# Patient Record
Sex: Female | Born: 1985 | Race: Black or African American | Hispanic: No | Marital: Married | Smoking: Never smoker
Health system: Southern US, Community
[De-identification: ages and names within clinical notes are randomized; demographics above are authoritative.]

## PROBLEM LIST (undated history)

## (undated) DIAGNOSIS — K219 Gastro-esophageal reflux disease without esophagitis: Secondary | ICD-10-CM

## (undated) DIAGNOSIS — D649 Anemia, unspecified: Secondary | ICD-10-CM

## (undated) DIAGNOSIS — R000 Tachycardia, unspecified: Secondary | ICD-10-CM

## (undated) DIAGNOSIS — E0500 Thyrotoxicosis with diffuse goiter without thyrotoxic crisis or storm: Secondary | ICD-10-CM

---

## 2012-12-30 ENCOUNTER — Encounter (HOSPITAL_COMMUNITY): Payer: Self-pay | Admitting: Emergency Medicine

## 2012-12-30 ENCOUNTER — Emergency Department (HOSPITAL_COMMUNITY)
Admission: EM | Admit: 2012-12-30 | Discharge: 2012-12-30 | Disposition: A | Discharge status: Home / Self Care | Payer: BC Managed Care – PPO | Attending: Emergency Medicine | Admitting: Emergency Medicine

## 2012-12-30 DIAGNOSIS — Z862 Personal history of diseases of the blood and blood-forming organs and certain disorders involving the immune mechanism: Secondary | ICD-10-CM | POA: Insufficient documentation

## 2012-12-30 DIAGNOSIS — Z8639 Personal history of other endocrine, nutritional and metabolic disease: Secondary | ICD-10-CM | POA: Insufficient documentation

## 2012-12-30 DIAGNOSIS — K089 Disorder of teeth and supporting structures, unspecified: Secondary | ICD-10-CM | POA: Insufficient documentation

## 2012-12-30 DIAGNOSIS — K029 Dental caries, unspecified: Secondary | ICD-10-CM | POA: Insufficient documentation

## 2012-12-30 DIAGNOSIS — R0982 Postnasal drip: Secondary | ICD-10-CM | POA: Insufficient documentation

## 2012-12-30 DIAGNOSIS — R0981 Nasal congestion: Secondary | ICD-10-CM

## 2012-12-30 DIAGNOSIS — H9209 Otalgia, unspecified ear: Secondary | ICD-10-CM | POA: Insufficient documentation

## 2012-12-30 DIAGNOSIS — J3489 Other specified disorders of nose and nasal sinuses: Secondary | ICD-10-CM | POA: Insufficient documentation

## 2012-12-30 HISTORY — DX: Thyrotoxicosis with diffuse goiter without thyrotoxic crisis or storm: E05.00

## 2012-12-30 MED ORDER — TRAMADOL HCL 50 MG PO TABS: 50.0000 mg | ORAL_TABLET | Freq: Four times a day (QID) | ORAL | Status: DC | PRN

## 2012-12-30 MED ORDER — OXYCODONE-ACETAMINOPHEN 5-325 MG PO TABS
2.0000 | ORAL_TABLET | Freq: Once | ORAL | Status: AC
  Administered 2012-12-30: 2 via ORAL
  Filled 2012-12-30: qty 2

## 2012-12-30 MED ORDER — PSEUDOEPHEDRINE HCL 60 MG PO TABS: 60.0000 mg | ORAL_TABLET | ORAL | Status: DC | PRN

## 2012-12-30 NOTE — ED Provider Notes (Signed)
CSN: [id number]     Arrival date & time 12/30/12  2158 History   First MD Initiated Contact with Patient 12/30/12 2204     Chief Complaint  Patient presents with  . Otalgia  . Dental Pain   (Consider location/radiation/quality/duration/timing/severity/associated sxs/prior Treatment) HPI Pt is a 27yo female c/o right sided dental pain and right ear pain x1 week, worsened Monday.  Had root canal 13mo ago.  Also c/o nasal congestion. Dental pain is constant, aching and throbbing, 7/10, worse with chewing.  Denies difficulty breathing or swallowing.  No fever, n/v/d. Has f/u appointment tomorrow afternoon for dental pain. Has taken aleve at home with minimal relief.  Past Medical History  Diagnosis Date  . Graves disease    History reviewed. No pertinent past surgical history. History reviewed. No pertinent family history. History  Substance Use Topics  . Smoking status: Never Smoker   . Smokeless tobacco: Not on file  . Alcohol Use: Yes   OB History   Grav Para Term Preterm Abortions TAB SAB Ect Mult Living                 Review of Systems  Constitutional: Negative for fever and chills.  HENT: Positive for congestion, dental problem, ear pain ( right) and postnasal drip. Negative for rhinorrhea, sinus pressure, sore throat, trouble swallowing and voice change.   Respiratory: Negative for shortness of breath.   Cardiovascular: Negative for chest pain.  Gastrointestinal: Negative for nausea, vomiting and abdominal pain.  All other systems reviewed and are negative.    Allergies  Review of patient's allergies indicates not on file.  Home Medications   Current Outpatient Rx  Name  Route  Sig  Dispense  Refill  . pseudoephedrine (SUDAFED) 60 MG tablet   Oral   Take 1 tablet (60 mg total) by mouth every 4 (four) hours as needed for congestion.   15 tablet   0   . traMADol (ULTRAM) 50 MG tablet   Oral   Take 1 tablet (50 mg total) by mouth every 6 (six) hours as needed  for moderate pain.   15 tablet   0    BP 140/85  Temp(Src) 98.8 F (37.1 C)  Resp 20  Ht 5\' 11"  (1.803 m)  Wt 285 lb (129.275 kg)  BMI 39.77 kg/m2  SpO2 100%  LMP 12/16/2012 Physical Exam  Nursing note and vitals reviewed. Constitutional: She is oriented to person, place, and time. She appears well-developed and well-nourished.  HENT:  Head: Normocephalic and atraumatic.  Right Ear: Hearing, tympanic membrane, external ear and ear canal normal.  Left Ear: Hearing, tympanic membrane, external ear and ear canal normal.  Nose: Mucosal edema present. Right sinus exhibits no maxillary sinus tenderness and no frontal sinus tenderness. Left sinus exhibits no maxillary sinus tenderness and no frontal sinus tenderness.  Mouth/Throat: Uvula is midline, oropharynx is clear and moist and mucous membranes are normal. No trismus in the jaw. Dental caries present. No dental abscesses.    Eyes: EOM are normal.  Neck: Normal range of motion.  Cardiovascular: Normal rate.   Pulmonary/Chest: Effort normal. No respiratory distress.  Musculoskeletal: Normal range of motion.  Neurological: She is alert and oriented to person, place, and time.  Skin: Skin is warm and dry.  Psychiatric: She has a normal mood and affect. Her behavior is normal.    ED Course  Procedures (including critical care time) Labs Review Labs Reviewed - No data to display Imaging Review No results  found.  EKG Interpretation   None       MDM   1. Pain due to dental caries   2. Nasal congestion    Pt with dental pain due to dental caries. No dental abscess. Pt also c/o nasal congestion.  Pt has f/u tomorrow with dentist, requesting pain medication until then.  On exam, no evidence of sinusitis, cellulitis, or dental abscess. Tx in ED: percocet. Rx: tramadol and sudafed. Advised to keep f/u appointment with dentist tomorrow. Pt verbalized understanding and agreement with tx plan.      Junius Finner, PA-C 12/31/12  [phone]

## 2012-12-30 NOTE — ED Notes (Signed)
Pt c/o rt ear pain and rt sided jaw pain.

## 2013-01-06 NOTE — ED Provider Notes (Signed)
Medical screening examination/treatment/procedure(s) were performed by non-physician practitioner and as supervising physician I was immediately available for consultation/collaboration.  EKG Interpretation   None         Robey Massmann W. Aarianna Hoadley, MD 01/06/13 0049 

## 2013-03-31 ENCOUNTER — Encounter (HOSPITAL_COMMUNITY): Payer: Self-pay | Admitting: Emergency Medicine

## 2013-03-31 ENCOUNTER — Emergency Department (HOSPITAL_COMMUNITY)
Admission: EM | Admit: 2013-03-31 | Discharge: 2013-03-31 | Discharge status: Against Medical Advice | Payer: BC Managed Care – PPO | Source: Home / Self Care

## 2013-03-31 ENCOUNTER — Emergency Department (HOSPITAL_COMMUNITY)
Admission: EM | Admit: 2013-03-31 | Discharge: 2013-04-01 | Disposition: A | Discharge status: Home / Self Care | Payer: BC Managed Care – PPO | Attending: Emergency Medicine | Admitting: Emergency Medicine

## 2013-03-31 ENCOUNTER — Emergency Department (HOSPITAL_COMMUNITY): Payer: BC Managed Care – PPO

## 2013-03-31 DIAGNOSIS — IMO0002 Reserved for concepts with insufficient information to code with codable children: Secondary | ICD-10-CM | POA: Insufficient documentation

## 2013-03-31 DIAGNOSIS — Z880 Allergy status to penicillin: Secondary | ICD-10-CM | POA: Insufficient documentation

## 2013-03-31 DIAGNOSIS — Z79899 Other long term (current) drug therapy: Secondary | ICD-10-CM | POA: Insufficient documentation

## 2013-03-31 DIAGNOSIS — Z862 Personal history of diseases of the blood and blood-forming organs and certain disorders involving the immune mechanism: Secondary | ICD-10-CM | POA: Insufficient documentation

## 2013-03-31 DIAGNOSIS — Z91040 Latex allergy status: Secondary | ICD-10-CM | POA: Insufficient documentation

## 2013-03-31 DIAGNOSIS — R209 Unspecified disturbances of skin sensation: Secondary | ICD-10-CM | POA: Insufficient documentation

## 2013-03-31 DIAGNOSIS — R1013 Epigastric pain: Secondary | ICD-10-CM | POA: Insufficient documentation

## 2013-03-31 DIAGNOSIS — R109 Unspecified abdominal pain: Secondary | ICD-10-CM

## 2013-03-31 DIAGNOSIS — Z3202 Encounter for pregnancy test, result negative: Secondary | ICD-10-CM | POA: Insufficient documentation

## 2013-03-31 DIAGNOSIS — R1012 Left upper quadrant pain: Secondary | ICD-10-CM | POA: Insufficient documentation

## 2013-03-31 DIAGNOSIS — Z8639 Personal history of other endocrine, nutritional and metabolic disease: Secondary | ICD-10-CM | POA: Insufficient documentation

## 2013-03-31 LAB — URINE MICROSCOPIC-ADD ON

## 2013-03-31 LAB — CBC WITH DIFFERENTIAL/PLATELET
BASOS PCT: 0 % (ref 0–1)
Basophils Absolute: 0 10*3/uL (ref 0.0–0.1)
EOS ABS: 0.2 10*3/uL (ref 0.0–0.7)
Eosinophils Relative: 2 % (ref 0–5)
HEMATOCRIT: 34.2 % — AB (ref 36.0–46.0)
Hemoglobin: 11.6 g/dL — ABNORMAL LOW (ref 12.0–15.0)
LYMPHS ABS: 2.3 10*3/uL (ref 0.7–4.0)
Lymphocytes Relative: 31 % (ref 12–46)
MCH: 27.3 pg (ref 26.0–34.0)
MCHC: 33.9 g/dL (ref 30.0–36.0)
MCV: 80.5 fL (ref 78.0–100.0)
MONO ABS: 0.4 10*3/uL (ref 0.1–1.0)
MONOS PCT: 6 % (ref 3–12)
NEUTROS ABS: 4.5 10*3/uL (ref 1.7–7.7)
NEUTROS PCT: 61 % (ref 43–77)
Platelets: 225 10*3/uL (ref 150–400)
RBC: 4.25 MIL/uL (ref 3.87–5.11)
RDW: 13.8 % (ref 11.5–15.5)
WBC: 7.4 10*3/uL (ref 4.0–10.5)

## 2013-03-31 LAB — URINALYSIS, ROUTINE W REFLEX MICROSCOPIC
Bilirubin Urine: NEGATIVE
Glucose, UA: NEGATIVE mg/dL
KETONES UR: NEGATIVE mg/dL
LEUKOCYTES UA: NEGATIVE
NITRITE: NEGATIVE
PROTEIN: NEGATIVE mg/dL
Specific Gravity, Urine: >1.03 — ABNORMAL HIGH (ref 1.005–1.030)
UROBILINOGEN UA: 0.2 mg/dL (ref 0.0–1.0)
pH: 5.5 (ref 5.0–8.0)

## 2013-03-31 LAB — COMPREHENSIVE METABOLIC PANEL
ALBUMIN: 4.3 g/dL (ref 3.5–5.2)
ALT: 20 U/L (ref 0–35)
AST: 32 U/L (ref 0–37)
Alkaline Phosphatase: 78 U/L (ref 39–117)
BUN: 9 mg/dL (ref 6–23)
CALCIUM: 9 mg/dL (ref 8.4–10.5)
CO2: 27 mEq/L (ref 19–32)
CREATININE: 1.15 mg/dL — AB (ref 0.50–1.10)
Chloride: 102 mEq/L (ref 96–112)
GFR calc Af Amer: 74 mL/min — ABNORMAL LOW (ref >90)
GFR calc non Af Amer: 64 mL/min — ABNORMAL LOW (ref >90)
Glucose, Bld: 89 mg/dL (ref 70–99)
Potassium: 4 mEq/L (ref 3.7–5.3)
SODIUM: 141 meq/L (ref 137–147)
TOTAL PROTEIN: 8.2 g/dL (ref 6.0–8.3)
Total Bilirubin: 1.1 mg/dL (ref 0.3–1.2)

## 2013-03-31 LAB — LIPASE, BLOOD: Lipase: 20 U/L (ref 11–59)

## 2013-03-31 LAB — POC URINE PREG, ED: PREG TEST UR: NEGATIVE

## 2013-03-31 MED ORDER — HYDROMORPHONE HCL PF 1 MG/ML IJ SOLN
0.5000 mg | Freq: Once | INTRAMUSCULAR | Status: AC
  Administered 2013-03-31: 0.5 mg via INTRAVENOUS
  Filled 2013-03-31: qty 1

## 2013-03-31 MED ORDER — IOHEXOL 300 MG/ML  SOLN
100.0000 mL | Freq: Once | INTRAMUSCULAR | Status: AC | PRN
  Administered 2013-03-31: 100 mL via INTRAVENOUS

## 2013-03-31 MED ORDER — OXYCODONE-ACETAMINOPHEN 5-325 MG PO TABS
1.0000 | ORAL_TABLET | ORAL | Status: AC | PRN
Start: 2013-03-31 — End: ?

## 2013-03-31 MED ORDER — ONDANSETRON HCL 4 MG/2ML IJ SOLN
4.0000 mg | Freq: Once | INTRAMUSCULAR | Status: AC
  Administered 2013-03-31: 4 mg via INTRAVENOUS
  Filled 2013-03-31: qty 2

## 2013-03-31 MED ORDER — MORPHINE SULFATE 4 MG/ML IJ SOLN
4.0000 mg | Freq: Once | INTRAMUSCULAR | Status: AC
  Administered 2013-03-31: 4 mg via INTRAVENOUS
  Filled 2013-03-31: qty 1

## 2013-03-31 MED ORDER — HYDROMORPHONE HCL PF 1 MG/ML IJ SOLN
1.0000 mg | Freq: Once | INTRAMUSCULAR | Status: AC
  Administered 2013-03-31: 1 mg via INTRAVENOUS
  Filled 2013-03-31: qty 1

## 2013-03-31 MED ORDER — IOHEXOL 300 MG/ML  SOLN
50.0000 mL | Freq: Once | INTRAMUSCULAR | Status: AC | PRN
  Administered 2013-03-31: 50 mL via ORAL

## 2013-03-31 NOTE — Discharge Instructions (Signed)

## 2013-03-31 NOTE — ED Notes (Signed)
Pt in c/o abd pain that started after sneezing, pt developed epigastric cramping, denies n/v, pain has continued since

## 2013-03-31 NOTE — ED Notes (Addendum)
Pt luq pain that radiates down. Pt sneezed this morning and got a cramp in the side of her abdomen. [facility] but was told she would have a 5hr wait so she came here.

## 2013-03-31 NOTE — ED Notes (Signed)
Pt c/o pain being unchanged after pain medication, EDP notified.

## 2013-03-31 NOTE — ED Provider Notes (Signed)
CSN: [id number]     Arrival date & time 03/31/13  1939 History  This chart was scribed for Joya Gaskins, MD by Ardelia Mems, ED Scribe. This patient was seen in room APA19/APA19 and the patient's care was started at 8:11 PM.   Chief Complaint  Patient presents with  . Abdominal Pain    Patient is a 28 y.o. female presenting with abdominal pain. The history is provided by the patient. No language interpreter was used.  Abdominal Pain Pain location:  LUQ Pain quality: cramping   Pain radiates to:  LLQ Pain severity:  Moderate Onset quality:  Gradual Duration:  1 day Timing:  Intermittent Progression:  Worsening Chronicity:  New Context comment:  Onset after sneezing Relieved by:  None tried Worsened by:  Nothing tried Ineffective treatments:  None tried Associated symptoms: no diarrhea, no dysuria, no fever, no vaginal bleeding, no vaginal discharge and no vomiting     HPI Comments: Holly Vaughn is a 28 y.o. female who presents to the Emergency Department complaining of intermittent LUQ abdominal pain that radiates to the LLQ abdomen onset this morning. She states that she sneezed this morning which caused the onset of her pain, and that the pain has persisted and worsened througout the day. She describes her pain as "cramping". She reports that she has been having some intermittent numbness on the left side of her abdomen for the past 2 months, and that this flares up when she is under stress. She states that she has not been evaluated for this. She states that she is not having any numbness currently. She denies any surgical abdominal history. She denies fever, vomiting, diarrhea, dysuria, vaginal bleeding or discharge, back pain or any other symptoms.   Past Medical History  Diagnosis Date  . Graves disease    History reviewed. No pertinent past surgical history. History reviewed. No pertinent family history. History  Substance Use Topics  . Smoking status: Never  Smoker   . Smokeless tobacco: Not on file  . Alcohol Use: Yes   OB History   Grav Para Term Preterm Abortions TAB SAB Ect Mult Living                 Review of Systems  Constitutional: Negative for fever.  Gastrointestinal: Positive for abdominal pain. Negative for vomiting and diarrhea.  Genitourinary: Negative for dysuria, vaginal bleeding and vaginal discharge.  Neurological: Positive for numbness (intermittent numbness to left abdomen for the past 2 months). Negative for weakness.  All other systems reviewed and are negative.   Allergies  Erythromycin; Latex; and Penicillins  Home Medications   Current Outpatient Rx  Name  Route  Sig  Dispense  Refill  . ferrous sulfate 325 (65 FE) MG tablet   Oral   Take 325 mg by mouth daily.         . fluticasone (FLONASE) 50 MCG/ACT nasal spray   Each Nare   Place 1 spray into both nostrils daily.          Triage Vitals: BP 145/87  Pulse 93  Temp(Src) 98.6 F (37 C) (Oral)  Resp 24  Ht 5\' 11"  (1.803 m)  Wt 297 lb (134.718 kg)  BMI 41.44 kg/m2  SpO2 100%  LMP 03/21/2013  Physical Exam  Nursing note and vitals reviewed. CONSTITUTIONAL: Well developed/well nourished HEAD: Normocephalic/atraumatic EYES: EOMI/PERRL ENMT: Mucous membranes moist NECK: supple no meningeal signs SPINE:entire spine nontender CV: S1/S2 noted, no murmurs/rubs/gallops noted LUNGS: Lungs are clear to auscultation  bilaterally, no apparent distress ABDOMEN: soft, moderate LUQ tenderness, no rebound or guarding GU:no cva tenderness NEURO: Pt is awake/alert, moves all extremitiesx4, no arm/leg drift.  No facial droop. EXTREMITIES: pulses normal, full ROM SKIN: warm, color normal PSYCH: no abnormalities of mood noted   ED Course  Procedures  DIAGNOSTIC STUDIES: Oxygen Saturation is 100% on RA, normal by my interpretation.    COORDINATION OF CARE: 8:16 PM- Discussed plan to order diagnostic lab work. Will also order Morphine and Zofran. Pt  advised of plan for treatment and pt agrees. 10:31 PM Pt with continued abd pain Will perform CT abd/pelvis She has no lower abd pain and denies vaginal complaints 11:48 PM CT negative Pt stable at this time I feel she is stable for d/c home We discussed strict return precautions   Medications  morphine 4 MG/ML injection 4 mg (4 mg Intravenous Given 03/31/13 2035)  ondansetron (ZOFRAN) injection 4 mg (4 mg Intravenous Given 03/31/13 2034)  HYDROmorphone (DILAUDID) injection 1 mg (1 mg Intravenous Given 03/31/13 2115)   Labs Review Labs Reviewed  URINALYSIS, ROUTINE W REFLEX MICROSCOPIC - Abnormal; Notable for the following:    Specific Gravity, Urine >1.030 (*)    Hgb urine dipstick TRACE (*)    All other components within normal limits  COMPREHENSIVE METABOLIC PANEL - Abnormal; Notable for the following:    Creatinine, Ser 1.15 (*)    GFR calc non Af Amer 64 (*)    GFR calc Af Amer 74 (*)    All other components within normal limits  CBC WITH DIFFERENTIAL - Abnormal; Notable for the following:    Hemoglobin 11.6 (*)    HCT 34.2 (*)    All other components within normal limits  LIPASE, BLOOD  URINE MICROSCOPIC-ADD ON  POC URINE PREG, ED   Imaging Review Ct Abdomen Pelvis W Contrast  03/31/2013   CLINICAL DATA Abdominal pain.  EXAM CT ABDOMEN AND PELVIS WITH CONTRAST  TECHNIQUE Multidetector CT imaging of the abdomen and pelvis was performed using the standard protocol following bolus administration of intravenous contrast.  CONTRAST 50mL OMNIPAQUE IOHEXOL 300 MG/ML SOLN, 100mL OMNIPAQUE IOHEXOL 300 MG/ML SOLN  COMPARISON None.  FINDINGS Visualized lung bases appear normal. No osseous abnormality is noted.  The liver, spleen and pancreas appear normal. No gallstones are noted. Adrenal glands and kidneys appear normal. No hydronephrosis or renal obstruction is noted. No renal or ureteral calculi are noted. The appendix appears normal. There is no evidence bowel obstruction. No  abnormal fluid collection is noted. Uterus and urinary bladder appear normal. Ovaries appear normal. No significant adenopathy is noted.  IMPRESSION No abnormality seen in the abdomen or pelvis.  SIGNATURE  Electronically Signed   By: Roque LiasJames  Green M.D.   On: 03/31/2013 23:24      MDM   Final diagnoses:  Abdominal pain    Nursing notes including past medical history and social history reviewed and considered in documentation Labs/vital reviewed and considered    I personally performed the services described in this documentation, which was scribed in my presence. The recorded information has been reviewed and is accurate.     Joya Gaskinsonald W Danielly Ackerley, MD 03/31/13 [phone]

## 2013-05-23 ENCOUNTER — Encounter (HOSPITAL_COMMUNITY): Payer: Self-pay | Admitting: Emergency Medicine

## 2013-05-23 ENCOUNTER — Emergency Department (HOSPITAL_COMMUNITY)
Admission: EM | Admit: 2013-05-23 | Discharge: 2013-05-24 | Disposition: A | Discharge status: Home / Self Care | Payer: BC Managed Care – PPO | Attending: Emergency Medicine | Admitting: Emergency Medicine

## 2013-05-23 DIAGNOSIS — Z880 Allergy status to penicillin: Secondary | ICD-10-CM | POA: Insufficient documentation

## 2013-05-23 DIAGNOSIS — Z91040 Latex allergy status: Secondary | ICD-10-CM | POA: Insufficient documentation

## 2013-05-23 DIAGNOSIS — K089 Disorder of teeth and supporting structures, unspecified: Secondary | ICD-10-CM | POA: Insufficient documentation

## 2013-05-23 DIAGNOSIS — K029 Dental caries, unspecified: Secondary | ICD-10-CM | POA: Insufficient documentation

## 2013-05-23 DIAGNOSIS — Z862 Personal history of diseases of the blood and blood-forming organs and certain disorders involving the immune mechanism: Secondary | ICD-10-CM | POA: Insufficient documentation

## 2013-05-23 DIAGNOSIS — R599 Enlarged lymph nodes, unspecified: Secondary | ICD-10-CM | POA: Insufficient documentation

## 2013-05-23 DIAGNOSIS — Z79899 Other long term (current) drug therapy: Secondary | ICD-10-CM | POA: Insufficient documentation

## 2013-05-23 DIAGNOSIS — Z8639 Personal history of other endocrine, nutritional and metabolic disease: Secondary | ICD-10-CM | POA: Insufficient documentation

## 2013-05-23 DIAGNOSIS — K08109 Complete loss of teeth, unspecified cause, unspecified class: Secondary | ICD-10-CM | POA: Insufficient documentation

## 2013-05-23 DIAGNOSIS — IMO0002 Reserved for concepts with insufficient information to code with codable children: Secondary | ICD-10-CM | POA: Insufficient documentation

## 2013-05-23 MED ORDER — OXYCODONE-ACETAMINOPHEN 5-325 MG PO TABS
1.0000 | ORAL_TABLET | Freq: Once | ORAL | Status: AC
  Administered 2013-05-23: 1 via ORAL
  Filled 2013-05-23: qty 1

## 2013-05-23 MED ORDER — CLINDAMYCIN HCL 150 MG PO CAPS: 150.0000 mg | ORAL_CAPSULE | Freq: Four times a day (QID) | ORAL | Status: DC

## 2013-05-23 MED ORDER — HYDROCODONE-ACETAMINOPHEN 5-325 MG PO TABS: 1.0000 | ORAL_TABLET | ORAL | Status: DC | PRN

## 2013-05-23 MED ORDER — CLINDAMYCIN HCL 150 MG PO CAPS
300.0000 mg | ORAL_CAPSULE | Freq: Once | ORAL | Status: AC
  Administered 2013-05-23: 300 mg via ORAL
  Filled 2013-05-23: qty 2

## 2013-05-23 NOTE — ED Provider Notes (Signed)
CSN: [id number]     Arrival date & time 05/23/13  2208 History   First MD Initiated Contact with Patient 05/23/13 2255     Chief Complaint  Patient presents with  . Dental Pain     (Consider location/radiation/quality/duration/timing/severity/associated sxs/prior Treatment) Patient is a 28 y.o. female presenting with tooth pain. The history is provided by the patient.  Dental Pain Location:  Upper Upper teeth location:  3/RU 1st molar Quality:  Throbbing and constant Severity:  Severe Onset quality:  Gradual Duration:  4 months Progression:  Worsening Relieved by:  Nothing Worsened by:  Cold food/drink, touching, pressure and hot food/drink Ineffective treatments:  Acetaminophen Associated symptoms: facial pain    Johnston EbbsJessica Miltenberger is a 28 y.o. female who presents to the ED with dental pain. She states that for the past 8 months she has been having problems. She has had one tooth on the lower right extracted and she has had all her wisdom teeth extracted. This tooth started hurting and has gotten progressively worse. The past 24 hours the pain has become severe. She has a dental appointment on May 15th but the pain got to bad to wait. She has had anxiety because she is to take exams tomorrow.   Past Medical History  Diagnosis Date  . Graves disease    History reviewed. No pertinent past surgical history. History reviewed. No pertinent family history. History  Substance Use Topics  . Smoking status: Never Smoker   . Smokeless tobacco: Not on file  . Alcohol Use: Yes   OB History   Grav Para Term Preterm Abortions TAB SAB Ect Mult Living                 Review of Systems Negative except as stated in HPI   Allergies  Erythromycin; Ivp dye; Latex; and Penicillins  Home Medications   Prior to Admission medications   Medication Sig Start Date End Date Taking? Authorizing Provider  ferrous sulfate 325 (65 FE) MG tablet Take 325 mg by mouth daily.    Historical Provider, MD    fluticasone (FLONASE) 50 MCG/ACT nasal spray Place 1 spray into both nostrils daily.    Historical Provider, MD  oxyCODONE-acetaminophen (PERCOCET/ROXICET) 5-325 MG per tablet Take 1 tablet by mouth every 4 (four) hours as needed for severe pain. 03/31/13   Joya Gaskinsonald W Wickline, MD   BP 130/85  Pulse 77  Temp(Src) 98 F (36.7 C) (Oral)  Resp 20  Ht 5\' 10"  (1.778 m)  Wt 286 lb (129.729 kg)  BMI 41.04 kg/m2  SpO2 100%  LMP 05/16/2013 Physical Exam  Nursing note and vitals reviewed. Constitutional: She is oriented to person, place, and time. She appears well-developed and well-nourished.  HENT:  Head: Normocephalic.  Right Ear: Tympanic membrane normal.  Left Ear: Tympanic membrane normal.  Nose: Nose normal.  Mouth/Throat: Uvula is midline, oropharynx is clear and moist and mucous membranes are normal. Dental caries present.    Eyes: Conjunctivae and EOM are normal.  Neck: Neck supple.  Cardiovascular: Normal rate and regular rhythm.   Pulmonary/Chest: Effort normal. She has no wheezes. She has no rales.  Abdominal: Soft. There is no tenderness.  Musculoskeletal: Normal range of motion.  Lymphadenopathy:    Cervical adenopathy: right.  Neurological: She is alert and oriented to person, place, and time. No cranial nerve deficit.  Skin: Skin is warm and dry.  Psychiatric: She has a normal mood and affect. Her behavior is normal.    ED Course  Procedures   MDM  28 y.o. female with dental pain and decay in the right upper 1st molar. Stable for discharge without facial swelling or abscess noted at this time. Will treat for pain and infection and she will follow up with her dentist as scheduled for extraction of the tooth. She will return here as needed.  Discussed with the patient and all questioned fully answered.    Medication List    TAKE these medications       clindamycin 150 MG capsule  Commonly known as:  CLEOCIN  Take 1 capsule (150 mg total) by mouth every 6 (six)  hours.     HYDROcodone-acetaminophen 5-325 MG per tablet  Commonly known as:  NORCO/VICODIN  Take 1 tablet by mouth every 4 (four) hours as needed.      ASK your doctor about these medications       ferrous sulfate 325 (65 FE) MG tablet  Take 325 mg by mouth daily.     fluticasone 50 MCG/ACT nasal spray  Commonly known as:  FLONASE  Place 1 spray into both nostrils daily.     oxyCODONE-acetaminophen 5-325 MG per tablet  Commonly known as:  PERCOCET/ROXICET  Take 1 tablet by mouth every 4 (four) hours as needed for severe pain.         [facility] Orlene OchM Ne[place], [place] 05/24/13 [phone]

## 2013-05-23 NOTE — ED Notes (Signed)
Patient complaining of right upper dental pain.

## 2013-05-23 NOTE — Discharge Instructions (Signed)
Dental Caries °Dental caries is tooth decay. This decay can cause a hole in teeth (cavity) that can get bigger and deeper over time. °HOME CARE °· Brush and floss your teeth. Do this at least two times a day. °· Use a fluoride toothpaste. °· Use a mouth rinse if told by your dentist or doctor. °· Eat less sugary and starchy foods. Drink less sugary drinks. °· Avoid snacking often on sugary and starchy foods. Avoid sipping often on sugary drinks. °· Keep regular checkups and cleanings with your dentist. °· Use fluoride supplements if told by your dentist or doctor. °· Allow fluoride to be applied to teeth if told by your dentist or doctor. °MAKE SURE YOU: °· Understand these instructions. °· Will watch your condition. °· Will get help right away if you are not doing well or get worse. °Document Released: 10/17/2007 Document Revised: 09/09/2012 Document Reviewed: 01/10/2012 °[employer]® Patient Information ©2014 [employer]. ° °Dental Pain °A tooth ache may be caused by cavities (tooth decay). Cavities expose the nerve of the tooth to air and hot or cold temperatures. It may come from an infection or abscess (also called a boil or furuncle) around your tooth. It is also often caused by dental caries (tooth decay). This causes the pain you are having. °DIAGNOSIS  °Your caregiver can diagnose this problem by exam. °TREATMENT  °· If caused by an infection, it may be treated with medications which kill germs (antibiotics) and pain medications as prescribed by your caregiver. Take medications as directed. °· Only take over-the-counter or prescription medicines for pain, discomfort, or fever as directed by your caregiver. °· Whether the tooth ache today is caused by infection or dental disease, you should see your dentist as soon as possible for further care. °SEEK MEDICAL CARE IF: °The exam and treatment you received today has been provided on an emergency basis only. This is not a substitute for complete medical or dental  care. If your problem worsens or new problems (symptoms) appear, and you are unable to meet with your dentist, call or return to this location. °SEEK IMMEDIATE MEDICAL CARE IF:  °· You have a fever. °· You develop redness and swelling of your face, jaw, or neck. °· You are unable to open your mouth. °· You have severe pain uncontrolled by pain medicine. °MAKE SURE YOU:  °· Understand these instructions. °· Will watch your condition. °· Will get help right away if you are not doing well or get worse. °Document Released: 01/07/2005 Document Revised: 04/01/2011 Document Reviewed: 08/26/2007 °[employer]® Patient Information ©2014 [employer]. ° °

## 2013-05-26 NOTE — ED Provider Notes (Signed)
Medical screening examination/treatment/procedure(s) were performed by non-physician practitioner and as supervising physician I was immediately available for consultation/collaboration.   Bodhi Stenglein M Kadeem Hyle, MD 05/26/13 2207 

## 2013-12-13 ENCOUNTER — Emergency Department (HOSPITAL_COMMUNITY): Payer: BC Managed Care – PPO

## 2013-12-13 ENCOUNTER — Encounter (HOSPITAL_COMMUNITY): Payer: Self-pay

## 2013-12-13 ENCOUNTER — Emergency Department (HOSPITAL_COMMUNITY)
Admission: EM | Admit: 2013-12-13 | Discharge: 2013-12-13 | Disposition: A | Discharge status: Home / Self Care | Payer: BC Managed Care – PPO | Attending: Emergency Medicine | Admitting: Emergency Medicine

## 2013-12-13 DIAGNOSIS — J029 Acute pharyngitis, unspecified: Secondary | ICD-10-CM

## 2013-12-13 DIAGNOSIS — Z880 Allergy status to penicillin: Secondary | ICD-10-CM | POA: Insufficient documentation

## 2013-12-13 DIAGNOSIS — R079 Chest pain, unspecified: Secondary | ICD-10-CM | POA: Insufficient documentation

## 2013-12-13 DIAGNOSIS — Z91040 Latex allergy status: Secondary | ICD-10-CM | POA: Insufficient documentation

## 2013-12-13 DIAGNOSIS — R000 Tachycardia, unspecified: Secondary | ICD-10-CM | POA: Diagnosis not present

## 2013-12-13 DIAGNOSIS — R630 Anorexia: Secondary | ICD-10-CM | POA: Diagnosis not present

## 2013-12-13 DIAGNOSIS — R05 Cough: Secondary | ICD-10-CM

## 2013-12-13 DIAGNOSIS — R059 Cough, unspecified: Secondary | ICD-10-CM

## 2013-12-13 DIAGNOSIS — M2550 Pain in unspecified joint: Secondary | ICD-10-CM | POA: Diagnosis not present

## 2013-12-13 DIAGNOSIS — Z79899 Other long term (current) drug therapy: Secondary | ICD-10-CM | POA: Insufficient documentation

## 2013-12-13 DIAGNOSIS — R61 Generalized hyperhidrosis: Secondary | ICD-10-CM | POA: Diagnosis not present

## 2013-12-13 LAB — CBC WITH DIFFERENTIAL/PLATELET
BASOS ABS: 0 10*3/uL (ref 0.0–0.1)
BASOS PCT: 0 % (ref 0–1)
EOS PCT: 0 % (ref 0–5)
Eosinophils Absolute: 0 10*3/uL (ref 0.0–0.7)
HCT: 39.6 % (ref 36.0–46.0)
Hemoglobin: 13.1 g/dL (ref 12.0–15.0)
Lymphocytes Relative: 14 % (ref 12–46)
Lymphs Abs: 1.4 10*3/uL (ref 0.7–4.0)
MCH: 26.9 pg (ref 26.0–34.0)
MCHC: 33.1 g/dL (ref 30.0–36.0)
MCV: 81.3 fL (ref 78.0–100.0)
Monocytes Absolute: 0.9 10*3/uL (ref 0.1–1.0)
Monocytes Relative: 9 % (ref 3–12)
Neutro Abs: 8.2 10*3/uL — ABNORMAL HIGH (ref 1.7–7.7)
Neutrophils Relative %: 77 % (ref 43–77)
Platelets: 257 10*3/uL (ref 150–400)
RBC: 4.87 MIL/uL (ref 3.87–5.11)
RDW: 12.8 % (ref 11.5–15.5)
WBC: 10.5 10*3/uL (ref 4.0–10.5)

## 2013-12-13 LAB — COMPREHENSIVE METABOLIC PANEL
ALBUMIN: 4.3 g/dL (ref 3.5–5.2)
ALT: 17 U/L (ref 0–35)
ANION GAP: 15 (ref 5–15)
AST: 17 U/L (ref 0–37)
Alkaline Phosphatase: 67 U/L (ref 39–117)
BILIRUBIN TOTAL: 0.8 mg/dL (ref 0.3–1.2)
BUN: 8 mg/dL (ref 6–23)
CO2: 25 mEq/L (ref 19–32)
CREATININE: 1.15 mg/dL — AB (ref 0.50–1.10)
Calcium: 9.7 mg/dL (ref 8.4–10.5)
Chloride: 96 mEq/L (ref 96–112)
GFR calc Af Amer: 74 mL/min — ABNORMAL LOW (ref >90)
GFR calc non Af Amer: 64 mL/min — ABNORMAL LOW (ref >90)
Glucose, Bld: 102 mg/dL — ABNORMAL HIGH (ref 70–99)
Potassium: 3.9 mEq/L (ref 3.7–5.3)
Sodium: 136 mEq/L — ABNORMAL LOW (ref 137–147)
TOTAL PROTEIN: 8.9 g/dL — AB (ref 6.0–8.3)

## 2013-12-13 LAB — D-DIMER, QUANTITATIVE: D-Dimer, Quant: 0.48 ug/mL-FEU (ref 0.00–0.48)

## 2013-12-13 LAB — MONONUCLEOSIS SCREEN: Mono Screen: NEGATIVE

## 2013-12-13 LAB — RAPID STREP SCREEN (MED CTR MEBANE ONLY): STREPTOCOCCUS, GROUP A SCREEN (DIRECT): NEGATIVE

## 2013-12-13 LAB — TROPONIN I

## 2013-12-13 LAB — TSH: TSH: 0.086 u[IU]/mL — ABNORMAL LOW (ref 0.350–4.500)

## 2013-12-13 MED ORDER — SODIUM CHLORIDE 0.9 % IV BOLUS (SEPSIS)
1000.0000 mL | Freq: Once | INTRAVENOUS | Status: AC
  Administered 2013-12-13: 1000 mL via INTRAVENOUS

## 2013-12-13 MED ORDER — CLINDAMYCIN HCL 300 MG PO CAPS: 300.0000 mg | ORAL_CAPSULE | Freq: Three times a day (TID) | ORAL | Status: AC

## 2013-12-13 MED ORDER — HYDROCODONE-ACETAMINOPHEN 5-325 MG PO TABS: 2.0000 | ORAL_TABLET | ORAL | Status: AC | PRN

## 2013-12-13 MED ORDER — ACETAMINOPHEN 325 MG PO TABS
ORAL_TABLET | ORAL | Status: AC
  Filled 2013-12-13: qty 2

## 2013-12-13 MED ORDER — IBUPROFEN 800 MG PO TABS
800.0000 mg | ORAL_TABLET | Freq: Once | ORAL | Status: AC
  Administered 2013-12-13: 800 mg via ORAL
  Filled 2013-12-13: qty 1

## 2013-12-13 MED ORDER — ACETAMINOPHEN 325 MG PO TABS
650.0000 mg | ORAL_TABLET | Freq: Once | ORAL | Status: AC
  Administered 2013-12-13: 650 mg via ORAL

## 2013-12-13 NOTE — ED Notes (Signed)
Sore throat with fever at home per pt x3-4 days.

## 2013-12-13 NOTE — ED Provider Notes (Signed)
CSN: [id number]     Arrival date & time 12/13/13  1441 History   First MD Initiated Contact with Patient 12/13/13 1516     Chief Complaint  Patient presents with  . Sore Throat     (Consider location/radiation/quality/duration/timing/severity/associated sxs/prior Treatment) HPI Comments: Patient reports 4 day history of sore throat with pain with swallowing and voice change. Endorses fever to 1031 day as well as decreased appetite and cough productive of clear mucus. Has some chest tightness that is constant and reproducible and similar to her previous episodes of anxiety. She denies any runny nose, shortness of breath, abdominal pain, nausea or vomiting. She denies any focal weakness, numbness or tingling. She took ibuprofen when she arrived at the hospital. She has not had any sick contacts.  The history is provided by the patient and a caregiver. The history is limited by the condition of the patient.    Past Medical History  Diagnosis Date  . Graves disease    History reviewed. No pertinent past surgical history. No family history on file. History  Substance Use Topics  . Smoking status: Never Smoker   . Smokeless tobacco: Not on file  . Alcohol Use: Yes   OB History    No data available     Review of Systems  Constitutional: Positive for fever, activity change and appetite change.  HENT: Positive for rhinorrhea and sore throat. Negative for congestion.   Respiratory: Negative for cough, chest tightness and shortness of breath.   Cardiovascular: Positive for chest pain.  Gastrointestinal: Negative for nausea, vomiting and abdominal pain.  Genitourinary: Negative for vaginal bleeding and vaginal discharge.  Musculoskeletal: Positive for myalgias and arthralgias. Negative for back pain, neck pain and neck stiffness.  Skin: Negative for rash.  Neurological: Negative for dizziness, weakness and headaches.   A complete 10 system review of systems was obtained and all  systems are negative except as noted in the HPI and PMH.     Allergies  Erythromycin; Ivp dye; Penicillins; and Latex  Home Medications   Prior to Admission medications   Medication Sig Start Date End Date Taking? Authorizing Provider  ALPRAZolam (XANAX) 0.25 MG tablet Take 0.25 mg by mouth 3 (three) times daily. 12/10/13  Yes Historical Provider, MD  ferrous sulfate 325 (65 FE) MG tablet Take 325 mg by mouth daily.   Yes Historical Provider, MD  hydrochlorothiazide (MICROZIDE) 12.5 MG capsule Take 12.5 mg by mouth daily. 11/23/13  Yes Historical Provider, MD  levothyroxine (SYNTHROID, LEVOTHROID) 150 MCG tablet Take 150 mcg by mouth daily.  11/01/13  Yes Historical Provider, MD  clindamycin (CLEOCIN) 300 MG capsule Take 1 capsule (300 mg total) by mouth 3 (three) times daily. 12/13/13   Glynn OctaveStephen Jaquia Benedicto, MD  HYDROcodone-acetaminophen (NORCO/VICODIN) 5-325 MG per tablet Take 2 tablets by mouth every 4 (four) hours as needed. 12/13/13   Glynn OctaveStephen Laylee Schooley, MD  oxyCODONE-acetaminophen (PERCOCET/ROXICET) 5-325 MG per tablet Take 1 tablet by mouth every 4 (four) hours as needed for severe pain. Patient not taking: Reported on 12/13/2013 03/31/13   Joya Gaskinsonald W Wickline, MD   BP 126/83 mmHg  Pulse 96  Temp(Src) 98.4 F (36.9 C) (Oral)  Resp 16  SpO2 99%  LMP 11/29/2013 Physical Exam  Constitutional: She is oriented to person, place, and time. She appears well-developed and well-nourished. No distress.  HENT:  Head: Normocephalic and atraumatic.  Mouth/Throat: Oropharyngeal exudate present.  Bilateral tonsillar exudates, no asymmetry, floor of mouth soft, no trismus  Hoarse voice.  Eyes:  Conjunctivae and EOM are normal. Pupils are equal, round, and reactive to light.  Neck: Normal range of motion. Neck supple.  No meningismus.  Cardiovascular: Normal rate, regular rhythm, normal heart sounds and intact distal pulses.   No murmur heard. tachycardic  Pulmonary/Chest: Effort normal and breath  sounds normal. No respiratory distress. She exhibits tenderness.  Central chest tenderness  Abdominal: Soft. There is no tenderness. There is no rebound and no guarding.  Musculoskeletal: Normal range of motion. She exhibits no edema or tenderness.  Lymphadenopathy:    She has cervical adenopathy.  Neurological: She is alert and oriented to person, place, and time. No cranial nerve deficit. She exhibits normal muscle tone. Coordination normal.  No ataxia on finger to nose bilaterally. No pronator drift. 5/5 strength throughout. CN 2-12 intact. Negative Romberg. Equal grip strength. Sensation intact. Gait is normal.   Skin: Skin is warm. She is diaphoretic.  Psychiatric: She has a normal mood and affect. Her behavior is normal.  Nursing note and vitals reviewed.   ED Course  Procedures (including critical care time) Labs Review Labs Reviewed  CBC WITH DIFFERENTIAL - Abnormal; Notable for the following:    Neutro Abs 8.2 (*)    All other components within normal limits  COMPREHENSIVE METABOLIC PANEL - Abnormal; Notable for the following:    Sodium 136 (*)    Glucose, Bld 102 (*)    Creatinine, Ser 1.15 (*)    Total Protein 8.9 (*)    GFR calc non Af Amer 64 (*)    GFR calc Af Amer 74 (*)    All other components within normal limits  RAPID STREP SCREEN  CULTURE, GROUP A STREP  TROPONIN I  MONONUCLEOSIS SCREEN  D-DIMER, QUANTITATIVE  TSH    Imaging Review Dg Chest 2 View  12/13/2013   CLINICAL DATA:  Cough and congestion for 1 week  EXAM: CHEST  2 VIEW  COMPARISON:  None  FINDINGS: The heart size and mediastinal contours are within normal limits. Both lungs are clear. The visualized skeletal structures are unremarkable.  IMPRESSION: No active cardiopulmonary disease.   Electronically Signed   By: Alcide Clever M.D.   On: 12/13/2013 15:19   Ct Soft Tissue Neck Wo Contrast  12/13/2013   CLINICAL DATA:  Sore throat for 2 weeks.  EXAM: CT NECK WITHOUT CONTRAST  TECHNIQUE:  Multidetector CT imaging of the neck was performed following the standard protocol without intravenous contrast.  COMPARISON:  None.  FINDINGS: The adenoids and lingual tonsils are prominent. Prevertebral soft tissues are normal except for what appear to be 2 small lymph nodes anterior to the C2 vertebra. There are reactive slightly enlarged lymph nodes in both sides of the neck. The largest lymph node is 14 mm minimum diameter with a fatty hilum consistent with a reactive node.  The osseous structures are normal.  Parotid and submandibular glands are normal. Thyroid gland is normal. Lung apices are normal.  IMPRESSION: 1. Enlarged lymphoid tissues throughout Waldeyer's ring. No visible abscesses. 2. Reactive slight adenopathy in the neck and prevertebral soft tissues.   Electronically Signed   By: Geanie Cooley M.D.   On: 12/13/2013 16:58     EKG Interpretation   Date/Time:  Monday December 13 2013 17:11:47 EST Ventricular Rate:  103 PR Interval:  148 QRS Duration: 91 QT Interval:  343 QTC Calculation: 449 R Axis:   65 Text Interpretation:  Sinus tachycardia Borderline T wave abnormalities No  previous ECGs available Confirmed by [employer]  MD, Jeannett SeniorSTEPHEN [phone]([id number]) on  12/13/2013 5:15:53 PM      MDM   Final diagnoses:  Sore throat  Pharyngitis   3 days history of sore throat with fever and cough. Reproducible chest pain for the past 2 days.  Sinus tachycardia and EKG. Troponin negative. D-dimer negative. Heart rate and fever improved with IV fluids. CT scan does not show any peritonsillar abscess. We'll treat empirically for possible strep.  Chest pain is constant and reproducible. Atypical for ACS. D-dimer negative. Doubt PE.  Heart rate improved to 100. Patient is tolerating by mouth. She is requesting to go home. We'll treat with clindamycin and follow up with PCP. Return precautions discussed.  BP 126/83 mmHg  Pulse 96  Temp(Src) 98.4 F (36.9 C) (Oral)  Resp 16  SpO2 99%  LMP  11/29/2013      Glynn OctaveStephen Adilen Pavelko, MD 12/13/13 1902

## 2013-12-13 NOTE — Discharge Instructions (Signed)
Pharyngitis Take the antibiotics as prescribed. Follow up with your doctor. Return to the ED if you develop new or worsening symptoms. Pharyngitis is redness, pain, and swelling (inflammation) of your pharynx.  CAUSES  Pharyngitis is usually caused by infection. Most of the time, these infections are from viruses (viral) and are part of a cold. However, sometimes pharyngitis is caused by bacteria (bacterial). Pharyngitis can also be caused by allergies. Viral pharyngitis may be spread from person to person by coughing, sneezing, and personal items or utensils (cups, forks, spoons, toothbrushes). Bacterial pharyngitis may be spread from person to person by more intimate contact, such as kissing.  SIGNS AND SYMPTOMS  Symptoms of pharyngitis include:   Sore throat.   Tiredness (fatigue).   Low-grade fever.   Headache.  Joint pain and muscle aches.  Skin rashes.  Swollen lymph nodes.  Plaque-like film on throat or tonsils (often seen with bacterial pharyngitis). DIAGNOSIS  Your health care provider will ask you questions about your illness and your symptoms. Your medical history, along with a physical exam, is often all that is needed to diagnose pharyngitis. Sometimes, a rapid strep test is done. Other lab tests may also be done, depending on the suspected cause.  TREATMENT  Viral pharyngitis will usually get better in 3-4 days without the use of medicine. Bacterial pharyngitis is treated with medicines that kill germs (antibiotics).  HOME CARE INSTRUCTIONS   Drink enough water and fluids to keep your urine clear or pale yellow.   Only take over-the-counter or prescription medicines as directed by your health care provider:   If you are prescribed antibiotics, make sure you finish them even if you start to feel better.   Do not take aspirin.   Get lots of rest.   Gargle with 8 oz of salt water ( tsp of salt per 1 qt of water) as often as every 1-2 hours to soothe your  throat.   Throat lozenges (if you are not at risk for choking) or sprays may be used to soothe your throat. SEEK MEDICAL CARE IF:   You have large, tender lumps in your neck.  You have a rash.  You cough up green, yellow-brown, or bloody spit. SEEK IMMEDIATE MEDICAL CARE IF:   Your neck becomes stiff.  You drool or are unable to swallow liquids.  You vomit or are unable to keep medicines or liquids down.  You have severe pain that does not go away with the use of recommended medicines.  You have trouble breathing (not caused by a stuffy nose). MAKE SURE YOU:   Understand these instructions.  Will watch your condition.  Will get help right away if you are not doing well or get worse. Document Released: 01/07/2005 Document Revised: 10/28/2012 Document Reviewed: 09/14/2012 [facility] Patient Information 2015 [employer], [place]. This information is not intended to replace advice given to you by your health care provider. Make sure you discuss any questions you have with your health care provider.

## 2013-12-13 NOTE — ED Notes (Signed)
Dr. Manus Gunningancour at bedside; see EDP note.

## 2013-12-13 NOTE — ED Notes (Signed)
Patient given water and orange juice to complete fluid challenge

## 2013-12-14 ENCOUNTER — Emergency Department (HOSPITAL_COMMUNITY)
Admission: EM | Admit: 2013-12-14 | Discharge: 2013-12-15 | Disposition: A | Discharge status: Home / Self Care | Payer: BC Managed Care – PPO | Attending: Emergency Medicine | Admitting: Emergency Medicine

## 2013-12-14 ENCOUNTER — Encounter (HOSPITAL_COMMUNITY): Payer: Self-pay | Admitting: Emergency Medicine

## 2013-12-14 DIAGNOSIS — Z79899 Other long term (current) drug therapy: Secondary | ICD-10-CM | POA: Insufficient documentation

## 2013-12-14 DIAGNOSIS — Z880 Allergy status to penicillin: Secondary | ICD-10-CM | POA: Diagnosis not present

## 2013-12-14 DIAGNOSIS — R946 Abnormal results of thyroid function studies: Secondary | ICD-10-CM | POA: Diagnosis not present

## 2013-12-14 DIAGNOSIS — Z8639 Personal history of other endocrine, nutritional and metabolic disease: Secondary | ICD-10-CM | POA: Diagnosis not present

## 2013-12-14 DIAGNOSIS — J029 Acute pharyngitis, unspecified: Secondary | ICD-10-CM | POA: Insufficient documentation

## 2013-12-14 DIAGNOSIS — Z792 Long term (current) use of antibiotics: Secondary | ICD-10-CM | POA: Insufficient documentation

## 2013-12-14 DIAGNOSIS — Z91040 Latex allergy status: Secondary | ICD-10-CM | POA: Insufficient documentation

## 2013-12-14 DIAGNOSIS — R079 Chest pain, unspecified: Secondary | ICD-10-CM | POA: Insufficient documentation

## 2013-12-14 DIAGNOSIS — R7989 Other specified abnormal findings of blood chemistry: Secondary | ICD-10-CM

## 2013-12-14 DIAGNOSIS — R509 Fever, unspecified: Secondary | ICD-10-CM | POA: Diagnosis present

## 2013-12-14 NOTE — ED Provider Notes (Signed)
CSN: [id number]     Arrival date & time 12/14/13  2153 History  This chart was scribed for Holly Vaughn Nikitas Davtyan, MD by Murriel HopperAlec Bankhead, ED Scribe. This patient was seen in room [facility]04[facility]04 and the patient's care was started at 12:01 AM.    Chief Complaint  Patient presents with  . Fever   The history is provided by the patient. No language interpreter was used.     HPI Comments: Holly Vaughn is a 28 y.o. female who presents to the Emergency Department complaining of a fever for the past three days with associated chest tightness, cough, sore throat and nasal congestion. Pt was seen in ED yesterday for same complaint. She had negative mononucleosis screen, negative strep test, negative CT of neck, and unremarkable chest x-ray and other labs. Her TSH, however, is noted to be very low (0.086). Pt was started on Clindamycin and hydrocodone yesterday. Pt states that she decided to return to ED again today because her fever has been high (102.4, improved with Tylenol) and because she has felt that her heart is "beating out of her chest". Pt states that the onset of her cold-like symptoms was two weeks ago, but the past three days she developed the fever. Pt denies vomiting or diarrhea; she is having some epigastric discomfort. Her sore throat is "really bad" and worse with swallowing.      Past Medical History  Diagnosis Date  . Graves disease    History reviewed. No pertinent past surgical history. History reviewed. No pertinent family history. History  Substance Use Topics  . Smoking status: Never Smoker   . Smokeless tobacco: Not on file  . Alcohol Use: Yes   OB History    No data available     Review of Systems  Constitutional: Positive for fever.  HENT: Positive for congestion and sore throat.   Cardiovascular: Positive for chest pain.  Gastrointestinal: Positive for abdominal pain. Negative for vomiting.  All other systems reviewed and are negative.   Allergies  Erythromycin; Ivp  dye; Penicillins; and Latex  Home Medications   Prior to Admission medications   Medication Sig Start Date End Date Taking? Authorizing Provider  ALPRAZolam (XANAX) 0.25 MG tablet Take 0.25 mg by mouth 3 (three) times daily. 12/10/13   Historical Provider, MD  clindamycin (CLEOCIN) 300 MG capsule Take 1 capsule (300 mg total) by mouth 3 (three) times daily. 12/13/13   Glynn OctaveStephen Rancour, MD  ferrous sulfate 325 (65 FE) MG tablet Take 325 mg by mouth daily.    Historical Provider, MD  hydrochlorothiazide (MICROZIDE) 12.5 MG capsule Take 12.5 mg by mouth daily. 11/23/13   Historical Provider, MD  HYDROcodone-acetaminophen (NORCO/VICODIN) 5-325 MG per tablet Take 2 tablets by mouth every 4 (four) hours as needed. 12/13/13   Glynn OctaveStephen Rancour, MD  levothyroxine (SYNTHROID, LEVOTHROID) 150 MCG tablet Take 150 mcg by mouth daily.  11/01/13   Historical Provider, MD  oxyCODONE-acetaminophen (PERCOCET/ROXICET) 5-325 MG per tablet Take 1 tablet by mouth every 4 (four) hours as needed for severe pain. Patient not taking: Reported on 12/13/2013 03/31/13   Joya Gaskinsonald W Wickline, MD   BP 118/67 mmHg  Pulse 116  Temp(Src) 99.9 F (37.7 C) (Oral)  Resp 18  Wt 286 lb (129.729 kg)  SpO2 96%  LMP 11/29/2013   Physical Exam General: Well-developed, well-nourished female in no acute distress; appearance consistent with age of record HENT: normocephalic; atraumatic; pharyngeal edema without exudate; uvula midline; no trismus Eyes: pupils equal, round and reactive to  light; extraocular muscles intact Neck: supple; anterior cervical lymphadenopathy Heart: regular rate and rhythm; tachycardia Lungs: clear to auscultation bilaterally Abdomen: soft; nondistended; nontender; no masses or hepatosplenomegaly; bowel sounds present; mild epigastric tenderness Extremities: No deformity; full range of motion; pulses normal Neurologic: Awake, alert and oriented; motor function intact in all extremities and symmetric; no facial  droop Skin: Warm and dry Psychiatric: Normal mood and affect  ED Course  Procedures (including critical care time)  DIAGNOSTIC STUDIES: Oxygen Saturation is 96% on RA, adequate by my interpretation.    COORDINATION OF CARE: 12:05 AM Discussed treatment plan with pt at bedside and pt agreed to plan.   MDM  12:21 AM Patient was advised of her significantly low TSH will need to have her thyroid reevaluated by her endocrinologist. The workup done on her previous visit was extensive. We will add a test for HIV antibody as HIV seroconversion can cause a mononucleosis-like syndrome.    Final diagnoses:  Pharyngitis  Low TSH level   I personally performed the services described in this documentation, which was scribed in my presence. The recorded information has been reviewed and is accurate.   Holly Vaughn Lari Linson, MD 12/15/13 [phone]

## 2013-12-14 NOTE — ED Notes (Signed)
Pt c/o fever, cough and chest pain for a few days. Pt was seen for the same yesterday.

## 2013-12-15 LAB — HIV ANTIBODY (ROUTINE TESTING W REFLEX): HIV 1&2 Ab, 4th Generation: NONREACTIVE

## 2013-12-15 LAB — CULTURE, GROUP A STREP

## 2013-12-15 MED ORDER — HYDROCODONE-ACETAMINOPHEN 7.5-325 MG/15ML PO SOLN
10.0000 mL | Freq: Once | ORAL | Status: AC
  Administered 2013-12-15: 10 mL via ORAL
  Filled 2013-12-15: qty 15

## 2013-12-15 NOTE — ED Notes (Signed)
Discharge instructions given and reviewed with patient.  Patient verbalized understanding to follow up with her PMD that prescribes her thyroid medication.  Patient ambulatory; discharged home in good condition.

## 2014-08-22 HISTORY — PX: STOMACH SURGERY: SHX791

## 2015-03-23 ENCOUNTER — Encounter (HOSPITAL_COMMUNITY): Payer: Self-pay | Admitting: *Deleted

## 2015-03-23 ENCOUNTER — Emergency Department (HOSPITAL_COMMUNITY)
Admission: EM | Admit: 2015-03-23 | Discharge: 2015-03-24 | Disposition: A | Discharge status: Home / Self Care | Payer: Self-pay | Attending: Emergency Medicine | Admitting: Emergency Medicine

## 2015-03-23 ENCOUNTER — Emergency Department (HOSPITAL_COMMUNITY): Payer: Self-pay

## 2015-03-23 DIAGNOSIS — R112 Nausea with vomiting, unspecified: Secondary | ICD-10-CM | POA: Insufficient documentation

## 2015-03-23 DIAGNOSIS — Z79899 Other long term (current) drug therapy: Secondary | ICD-10-CM | POA: Insufficient documentation

## 2015-03-23 DIAGNOSIS — R197 Diarrhea, unspecified: Secondary | ICD-10-CM | POA: Insufficient documentation

## 2015-03-23 HISTORY — DX: Gastro-esophageal reflux disease without esophagitis: K21.9

## 2015-03-23 HISTORY — DX: Anemia, unspecified: D64.9

## 2015-03-23 HISTORY — DX: Tachycardia, unspecified: R00.0

## 2015-03-23 LAB — COMPREHENSIVE METABOLIC PANEL WITH GFR
ALT: 15 U/L (ref 14–54)
AST: 15 U/L (ref 15–41)
Albumin: 4.5 g/dL (ref 3.5–5.0)
Alkaline Phosphatase: 55 U/L (ref 38–126)
Anion gap: 6 (ref 5–15)
BUN: 11 mg/dL (ref 6–20)
CO2: 22 mmol/L (ref 22–32)
Calcium: 8.9 mg/dL (ref 8.9–10.3)
Chloride: 106 mmol/L (ref 101–111)
Creatinine, Ser: 0.75 mg/dL (ref 0.44–1.00)
GFR calc Af Amer: >60 mL/min
GFR calc non Af Amer: >60 mL/min
Glucose, Bld: 92 mg/dL (ref 65–99)
Potassium: 3.9 mmol/L (ref 3.5–5.1)
Sodium: 134 mmol/L — ABNORMAL LOW (ref 135–145)
Total Bilirubin: 1.9 mg/dL — ABNORMAL HIGH (ref 0.3–1.2)
Total Protein: 8 g/dL (ref 6.5–8.1)

## 2015-03-23 LAB — PREGNANCY, URINE: PREG TEST UR: NEGATIVE

## 2015-03-23 LAB — URINALYSIS, ROUTINE W REFLEX MICROSCOPIC
Glucose, UA: NEGATIVE mg/dL
Hgb urine dipstick: NEGATIVE
Ketones, ur: >80 mg/dL — AB
Leukocytes, UA: NEGATIVE
Nitrite: NEGATIVE
Protein, ur: NEGATIVE mg/dL
Specific Gravity, Urine: >1.03 — ABNORMAL HIGH (ref 1.005–1.030)
pH: 5.5 (ref 5.0–8.0)

## 2015-03-23 LAB — LIPASE, BLOOD: Lipase: 29 U/L (ref 11–51)

## 2015-03-23 LAB — CBC WITH DIFFERENTIAL/PLATELET
BASOS PCT: 0 %
Basophils Absolute: 0 10*3/uL (ref 0.0–0.1)
Eosinophils Absolute: 0.1 10*3/uL (ref 0.0–0.7)
Eosinophils Relative: 1 %
HEMATOCRIT: 37.4 % (ref 36.0–46.0)
Hemoglobin: 12.3 g/dL (ref 12.0–15.0)
LYMPHS PCT: 5 %
Lymphs Abs: 0.3 10*3/uL — ABNORMAL LOW (ref 0.7–4.0)
MCH: 26.7 pg (ref 26.0–34.0)
MCHC: 32.9 g/dL (ref 30.0–36.0)
MCV: 81.3 fL (ref 78.0–100.0)
Monocytes Absolute: 0.5 10*3/uL (ref 0.1–1.0)
Monocytes Relative: 8 %
NEUTROS ABS: 4.7 10*3/uL (ref 1.7–7.7)
NEUTROS PCT: 86 %
Platelets: 203 10*3/uL (ref 150–400)
RBC: 4.6 MIL/uL (ref 3.87–5.11)
RDW: 13.3 % (ref 11.5–15.5)
WBC: 5.6 10*3/uL (ref 4.0–10.5)

## 2015-03-23 LAB — I-STAT BETA HCG BLOOD, ED (MC, WL, AP ONLY): I-stat hCG, quantitative: <5 m[IU]/mL (ref <5)

## 2015-03-23 MED ORDER — SODIUM CHLORIDE 0.9 % IV BOLUS (SEPSIS)
1000.0000 mL | Freq: Once | INTRAVENOUS | Status: AC
  Administered 2015-03-23: 1000 mL via INTRAVENOUS

## 2015-03-23 MED ORDER — DICYCLOMINE HCL 20 MG PO TABS: 20.0000 mg | ORAL_TABLET | Freq: Four times a day (QID) | ORAL | Status: AC | PRN

## 2015-03-23 MED ORDER — FAMOTIDINE IN NACL 20-0.9 MG/50ML-% IV SOLN
20.0000 mg | Freq: Once | INTRAVENOUS | Status: AC
  Administered 2015-03-23: 20 mg via INTRAVENOUS
  Filled 2015-03-23: qty 50

## 2015-03-23 MED ORDER — DICYCLOMINE HCL 10 MG/ML IM SOLN
20.0000 mg | Freq: Once | INTRAMUSCULAR | Status: AC
  Administered 2015-03-23: 20 mg via INTRAMUSCULAR
  Filled 2015-03-23: qty 2

## 2015-03-23 MED ORDER — ONDANSETRON HCL 4 MG PO TABS: 4.0000 mg | ORAL_TABLET | Freq: Three times a day (TID) | ORAL | Status: AC | PRN

## 2015-03-23 MED ORDER — ONDANSETRON HCL 4 MG/2ML IJ SOLN
4.0000 mg | INTRAMUSCULAR | Status: AC | PRN
  Administered 2015-03-23 (×2): 4 mg via INTRAVENOUS
  Filled 2015-03-23 (×2): qty 2

## 2015-03-23 NOTE — ED Notes (Signed)
Pt ambulated to restroom with a steady and even gait. Pt denied N/V/, dizziness, or weakness.

## 2015-03-23 NOTE — Discharge Instructions (Signed)
Take the prescriptions as directed.  Increase your fluid intake (ie:  Gatoraide) for the next few days.  Eat a bland diet and advance to your regular diet slowly as you can tolerate it.   Avoid full strength juices, as well as milk and milk products until your diarrhea has resolved.   Call your regular medical doctor tomorrow morning to schedule a follow up appointment in the next 2 days. Return to the Emergency Department immediately if not improving (or even worsening) despite taking the medicines as prescribed, any black or bloody stool or vomit, if you develop a fever over "101," or for any other concerns.

## 2015-03-23 NOTE — ED Notes (Signed)
Pt unable to urinate at this time.  

## 2015-03-23 NOTE — ED Notes (Signed)
Pt reporting abdominal pain, nausea and diarrhea since early this morning.

## 2015-03-23 NOTE — ED Provider Notes (Signed)
CSN: [id number]     Arrival date & time 03/23/15  2015 History   First MD Initiated Contact with Patient 03/23/15 2043     No chief complaint on file.     HPI  Pt was seen at 2045. Per pt, c/o gradual onset and persistence of multiple intermittent episodes of N/V/D that began overnight last night (approximately 0300). Pt had one episode of vomiting and multiple "watery" stools. Has been associated with generalized abd "cramping." Has been unable to tol PO today due to her symptoms.  Denies abd pain, no CP/SOB, no back pain, no fevers, no black or blood in stools or emesis.    Past Medical History  Diagnosis Date  . Graves disease   . GERD (gastroesophageal reflux disease)   . Anemia   . Tachycardia    Past Surgical History  Procedure Laterality Date  . Stomach surgery  08/2014    gastric sleeve    Social History  Substance Use Topics  . Smoking status: Never Smoker   . Smokeless tobacco: None  . Alcohol Use: Yes     Comment: occasional     Review of Systems ROS: Statement: All systems negative except as marked or noted in the HPI; Constitutional: Negative for fever and chills. ; ; Eyes: Negative for eye pain, redness and discharge. ; ; ENMT: Negative for ear pain, hoarseness, nasal congestion, sinus pressure and sore throat. ; ; Cardiovascular: Negative for chest pain, palpitations, diaphoresis, dyspnea and peripheral edema. ; ; Respiratory: Negative for cough, wheezing and stridor. ; ; Gastrointestinal: +N/V/D. Negative for abdominal pain, blood in stool, hematemesis, jaundice and rectal bleeding. . ; ; Genitourinary: Negative for dysuria, flank pain and hematuria. ; ; Musculoskeletal: Negative for back pain and neck pain. Negative for swelling and trauma.; ; Skin: Negative for pruritus, rash, abrasions, blisters, bruising and skin lesion.; ; Neuro: Negative for headache, lightheadedness and neck stiffness. Negative for weakness, altered level of consciousness , altered mental status,  extremity weakness, paresthesias, involuntary movement, seizure and syncope.        Allergies  Erythromycin; Ivp dye; Penicillins; and Latex  Home Medications   Prior to Admission medications   Medication Sig Start Date End Date Taking? Authorizing Provider  ALPRAZolam (XANAX) 0.25 MG tablet Take 0.25 mg by mouth 3 (three) times daily as needed.  12/10/13  Yes Historical Provider, MD  levothyroxine (SYNTHROID, LEVOTHROID) 175 MCG tablet Take 175 mcg by mouth daily before breakfast.   Yes Historical Provider, MD  traZODone (DESYREL) 50 MG tablet Take 50 mg by mouth at bedtime.   Yes Historical Provider, MD  clindamycin (CLEOCIN) 300 MG capsule Take 1 capsule (300 mg total) by mouth 3 (three) times daily. 12/13/13   Glynn Octave, MD  ferrous sulfate 325 (65 FE) MG tablet Take 325 mg by mouth daily.    Historical Provider, MD  hydrochlorothiazide (MICROZIDE) 12.5 MG capsule Take 12.5 mg by mouth daily. 11/23/13   Historical Provider, MD  HYDROcodone-acetaminophen (NORCO/VICODIN) 5-325 MG per tablet Take 2 tablets by mouth every 4 (four) hours as needed. 12/13/13   Glynn Octave, MD  levothyroxine (SYNTHROID, LEVOTHROID) 150 MCG tablet Take 150 mcg by mouth daily.  11/01/13   Historical Provider, MD  oxyCODONE-acetaminophen (PERCOCET/ROXICET) 5-325 MG per tablet Take 1 tablet by mouth every 4 (four) hours as needed for severe pain. Patient not taking: Reported on 12/13/2013 03/31/13   Zadie Rhine, MD   BP 108/71 mmHg  Pulse 117  Temp(Src) 98.8 F (37.1 C)  Resp 18  Ht 5\' 10"  (1.778 m)  Wt 197 lb (89.359 kg)  BMI 28.27 kg/m2  SpO2 100%  LMP 03/06/2015  BP 120/70 mmHg  Pulse 89  Temp(Src) 98.8 F (37.1 C)  Resp 16  Ht 5\' 10"  (1.778 m)  Wt 197 lb (89.359 kg)  BMI 28.27 kg/m2  SpO2 100%  LMP 03/06/2015  Physical Exam  2050: Physical examination:  Nursing notes reviewed; Vital signs and O2 SAT reviewed;  Constitutional: Well developed, Well nourished, In no acute  distress; Head:  Normocephalic, atraumatic; Eyes: EOMI, PERRL, No scleral icterus; ENMT: Mouth and pharynx normal, Mucous membranes dry; Neck: Supple, Full range of motion, No lymphadenopathy; Cardiovascular: Tachycardic rate and rhythm, No gallop; Respiratory: Breath sounds clear & equal bilaterally, No wheezes.  Speaking full sentences with ease, Normal respiratory effort/excursion; Chest: Nontender, Movement normal; Abdomen: Soft, Nontender, Nondistended, Normal bowel sounds; Genitourinary: No CVA tenderness; Extremities: Pulses normal, No tenderness, No edema, No calf edema or asymmetry.; Neuro: AA&Ox3, Major CN grossly intact.  Speech clear. No gross focal motor or sensory deficits in extremities.; Skin: Color normal, Warm, Dry.   ED Course  Procedures (including critical care time) Labs Review  Imaging Review  I have personally reviewed and evaluated these images and lab results as part of my medical decision-making.   EKG Interpretation None      MDM  MDM Reviewed: previous chart, vitals and nursing note Reviewed previous: labs Interpretation: labs and x-ray     Results for orders placed or performed during the hospital encounter of 03/23/15  Urinalysis, Routine w reflex microscopic (not at [facility])  Result Value Ref Range   Color, Urine YELLOW YELLOW   APPearance CLEAR CLEAR   Specific Gravity, Urine >1.030 (H) 1.005 - 1.030   pH 5.5 5.0 - 8.0   Glucose, UA NEGATIVE NEGATIVE mg/dL   Hgb urine dipstick NEGATIVE NEGATIVE   Bilirubin Urine SMALL (A) NEGATIVE   Ketones, ur >80 (A) NEGATIVE mg/dL   Protein, ur NEGATIVE NEGATIVE mg/dL   Nitrite NEGATIVE NEGATIVE   Leukocytes, UA NEGATIVE NEGATIVE  Pregnancy, urine  Result Value Ref Range   Preg Test, Ur NEGATIVE NEGATIVE  Comprehensive metabolic panel  Result Value Ref Range   Sodium 134 (L) 135 - 145 mmol/L   Potassium 3.9 3.5 - 5.1 mmol/L   Chloride 106 101 - 111 mmol/L   CO2 22 22 - 32 mmol/L   Glucose, Bld 92 65 - 99  mg/dL   BUN 11 6 - 20 mg/dL   Creatinine, Ser 1.61 0.44 - 1.00 mg/dL   Calcium 8.9 8.9 - 09.6 mg/dL   Total Protein 8.0 6.5 - 8.1 g/dL   Albumin 4.5 3.5 - 5.0 g/dL   AST 15 15 - 41 U/L   ALT 15 14 - 54 U/L   Alkaline Phosphatase 55 38 - 126 U/L   Total Bilirubin 1.9 (H) 0.3 - 1.2 mg/dL   GFR calc non Af Amer >60 >60 mL/min   GFR calc Af Amer >60 >60 mL/min   Anion gap 6 5 - 15  Lipase, blood  Result Value Ref Range   Lipase 29 11 - 51 U/L  CBC with Differential  Result Value Ref Range   WBC 5.6 4.0 - 10.5 K/uL   RBC 4.60 3.87 - 5.11 MIL/uL   Hemoglobin 12.3 12.0 - 15.0 g/dL   HCT 04.5 40.9 - 81.1 %   MCV 81.3 78.0 - 100.0 fL   MCH 26.7 26.0 - 34.0 pg  MCHC 32.9 30.0 - 36.0 g/dL   RDW 16.1 09.6 - 04.5 %   Platelets 203 150 - 400 K/uL   Neutrophils Relative % 86 %   Neutro Abs 4.7 1.7 - 7.7 K/uL   Lymphocytes Relative 5 %   Lymphs Abs 0.3 (L) 0.7 - 4.0 K/uL   Monocytes Relative 8 %   Monocytes Absolute 0.5 0.1 - 1.0 K/uL   Eosinophils Relative 1 %   Eosinophils Absolute 0.1 0.0 - 0.7 K/uL   Basophils Relative 0 %   Basophils Absolute 0.0 0.0 - 0.1 K/uL  I-Stat beta hCG blood, ED  Result Value Ref Range   I-stat hCG, quantitative <5.0 <5 mIU/mL   Comment 3           Dg Abd Acute W/chest 03/24/2015  CLINICAL DATA:  Weakness beginning at 0300 hours, nausea, vomiting and diarrhea. History gastric sleeve, Grave's disease. EXAM: DG ABDOMEN ACUTE W/ 1V CHEST COMPARISON:  Chest radiograph December 13, 2013 FINDINGS: Cardiomediastinal silhouette is normal. Lungs are clear, no pleural effusions. No pneumothorax. Soft tissue planes and included osseous structures are unremarkable. Bowel gas pattern is nondilated and nonobstructive. Surgical suture material LEFT upper quadrant consistent with history of gastric bypass. No intra-abdominal mass effect, pathologic calcifications or free air. Phleboliths in the pelvis. Soft tissue planes and included osseous structures are non-suspicious.  IMPRESSION: Normal chest. Normal bowel gas pattern. Electronically Signed   By: Awilda Metro M.D.   On: 03/24/2015 00:03    0015:  Workup reassuring. Pending PO trial. Sign out to Dr. Lynelle Doctor.   Samuel Jester, DO 03/24/15 0021

## 2015-03-23 NOTE — ED Notes (Signed)
Pt states she has had at least 10 D/ stools that started at 0300 today, reports 1 episode of emesis, and constant N/. Pt states she has not ate normally and has drank a small amount of fluids today. Reports she had a gastric sleeve procedure in August.

## 2015-03-23 NOTE — ED Notes (Signed)
Pt provided clean catch sample.

## 2015-03-24 NOTE — ED Notes (Signed)
Pt tolerating PO fluids at this time.

## 2015-03-24 NOTE — ED Provider Notes (Signed)
By signing my name below, I, Marisue HumbleMichelle Chaffee, attest that this documentation has been prepared under the direction and in the presence of Devoria AlbeIva Cydne Grahn, MD at [phone]. Electronically Signed: Marisue HumbleMichelle Chaffee, Scribe. 03/24/2015. 12:45 AM.   12:36 AM Informed pt of imaging results. She notes she still has mild abdominal cramping. Asked pt to drink some fluids.  Recheck 0210 Pt was able to drink fluids. States she has minimal abdominal cramping. Feels ready to go home. Pt drove herself to the ED.   Diagnoses that have been ruled out:  None  Diagnoses that are still under consideration:  None  Final diagnoses:  Nausea vomiting and diarrhea   New Prescriptions   DICYCLOMINE (BENTYL) 20 MG TABLET    Take 1 tablet (20 mg total) by mouth every 6 (six) hours as needed for spasms (abdominal cramping).   ONDANSETRON (ZOFRAN) 4 MG TABLET    Take 1 tablet (4 mg total) by mouth every 8 (eight) hours as needed for nausea or vomiting.    Plan discharge  Devoria AlbeIva Romilda Proby, MD, Concha PyoFACEP    Shaquoya Cosper, MD 03/24/15 [phone]

## 2015-03-24 NOTE — ED Notes (Signed)
Pt tolerating PO fluids. No complaints of N/V/D at this time.

## 2015-04-19 ENCOUNTER — Encounter (HOSPITAL_COMMUNITY): Payer: Self-pay | Admitting: Emergency Medicine

## 2015-04-19 DIAGNOSIS — Y829 Unspecified medical devices associated with adverse incidents: Secondary | ICD-10-CM | POA: Insufficient documentation

## 2015-04-19 DIAGNOSIS — R222 Localized swelling, mass and lump, trunk: Secondary | ICD-10-CM | POA: Insufficient documentation

## 2015-04-19 DIAGNOSIS — T8089XA Other complications following infusion, transfusion and therapeutic injection, initial encounter: Secondary | ICD-10-CM | POA: Insufficient documentation

## 2015-04-19 NOTE — ED Notes (Signed)
Pt c/o constipation and c/o knot to the rt buttocks where she got an injection last time she was here.

## 2015-04-20 ENCOUNTER — Emergency Department (HOSPITAL_COMMUNITY)
Admission: EM | Admit: 2015-04-20 | Discharge: 2015-04-20 | Disposition: A | Discharge status: Home / Self Care | Payer: Self-pay | Attending: Emergency Medicine | Admitting: Emergency Medicine

## 2015-04-20 ENCOUNTER — Emergency Department (HOSPITAL_COMMUNITY): Payer: Self-pay

## 2015-04-20 DIAGNOSIS — T8090XA Unspecified complication following infusion and therapeutic injection, initial encounter: Secondary | ICD-10-CM

## 2015-04-20 DIAGNOSIS — K5900 Constipation, unspecified: Secondary | ICD-10-CM

## 2015-04-20 NOTE — ED Provider Notes (Signed)
CSN: [id number]     Arrival date & time 04/19/15  2147 History   First MD Initiated Contact with Patient 04/20/15 0410    Chief Complaint  Patient presents with  . Constipation     (Consider location/radiation/quality/duration/timing/severity/associated sxs/prior Treatment) HPI patient reports she has not had a bowel movement only had small bowel movements over the past week. She denies abdominal pain now but states she gets pain "sometimes". She states she's been passing a lot of gas. She states she's had a lot of constipation problems before and sometimes has to take Senokot on a daily basis. She has tried Senokot this time without relief. She states she had a gastric sleeve done in August performed at [facility]. She has nausea sometimes but not right now and she denies vomiting. She states her abdomen feels full after she ate yesterday. She denies fever, dysuria, or frequency. She has had reflux in the past but not having it now. She states she's scheduled to have endoscopy done the first part of May 2 evaluate her reflux. This is going be done at [facility]. She states she used to be on Zantac twice a day with omeprazole and now she only takes omeprazole when necessary.  Patient states last time she was here she got an injection in her right hip for possibly Phenergan. She states she has a tender knot that she feels when she sits down.   PCP Dr Rodena Medin at [facility]  Past Medical History  Diagnosis Date  . Graves disease   . GERD (gastroesophageal reflux disease)   . Anemia   . Tachycardia    Past Surgical History  Procedure Laterality Date  . Stomach surgery  08/2014    gastric sleeve   History reviewed. No pertinent family history. Social History  Substance Use Topics  . Smoking status: Never Smoker   . Smokeless tobacco: None  . Alcohol Use: Yes     Comment: occasional    Full time post graduate student at [employer]  OB History    No data available     Review of Systems  All other systems reviewed and are negative.     Allergies  Erythromycin; Ivp dye; Metrizamide; Penicillins; and Latex  Home Medications   Prior to Admission medications   Medication Sig Start Date End Date Taking? Authorizing Provider  ALPRAZolam (XANAX) 0.25 MG tablet Take 0.25 mg by mouth 3 (three) times daily as needed for anxiety.  12/10/13   Historical Provider, MD  BIOTIN PO Take 1 tablet by mouth daily.    Historical Provider, MD  calcium-vitamin D (OSCAL WITH D) 500-200 MG-UNIT tablet Take 1 tablet by mouth 3 (three) times daily.    Historical Provider, MD  clindamycin (CLEOCIN) 300 MG capsule Take 1 capsule (300 mg total) by mouth 3 (three) times daily. 12/13/13   Glynn Octave, MD  Cyanocobalamin (VITAMIN B-12) 1000 MCG SUBL Place 1 tablet under the tongue daily.     Historical Provider, MD  dicyclomine (BENTYL) 20 MG tablet Take 1 tablet (20 mg total) by mouth every 6 (six) hours as needed for spasms (abdominal cramping). 03/23/15   Samuel Jester, DO  Fe Bisgly-Succ-C-Thre-B12-FA (IRON 21/7 PO) Take 1 tablet by mouth daily.    Historical Provider, MD  HYDROcodone-acetaminophen (NORCO/VICODIN) 5-325 MG per tablet Take 2 tablets by mouth every 4 (four) hours as needed. 12/13/13   Glynn Octave, MD  ipratropium (ATROVENT) 0.03 % nasal spray Place 2 sprays into  both nostrils 2 (two) times daily as needed for rhinitis.    Historical Provider, MD  levothyroxine (SYNTHROID, LEVOTHROID) 150 MCG tablet Take 150 mcg by mouth daily.  11/01/13   Historical Provider, MD  levothyroxine (SYNTHROID, LEVOTHROID) 175 MCG tablet Take 175 mcg by mouth daily before breakfast.    Historical Provider, MD  Magnesium 250 MG TABS Take 250 mg by mouth daily.    Historical Provider, MD  Multiple Vitamin (MULTI-VITAMINS) TABS Take 1 tablet by mouth 3 (three) times daily.    Historical Provider, MD  Omega-3 Fatty Acids (FISH OIL CONCENTRATE) 300 MG CAPS Take 1 capsule by mouth  daily.    Historical Provider, MD  omeprazole (PRILOSEC) 20 MG capsule Take 20 mg by mouth daily.    Historical Provider, MD  ondansetron (ZOFRAN) 4 MG tablet Take 1 tablet (4 mg total) by mouth every 8 (eight) hours as needed for nausea or vomiting. 03/23/15   Samuel Jester, DO  oxyCODONE-acetaminophen (PERCOCET/ROXICET) 5-325 MG per tablet Take 1 tablet by mouth every 4 (four) hours as needed for severe pain. Patient not taking: Reported on 12/13/2013 03/31/13   Zadie Rhine, MD  sennosides-docusate sodium (SENOKOT-S) 8.6-50 MG tablet Take 1 tablet by mouth 2 (two) times daily as needed for constipation.  09/14/14   Historical Provider, MD  sertraline (ZOLOFT) 50 MG tablet Take 75 mg by mouth daily.    Historical Provider, MD  traZODone (DESYREL) 50 MG tablet Take 50 mg by mouth at bedtime.    Historical Provider, MD   BP 115/70 mmHg  Pulse 79  Temp(Src) 98.4 F (36.9 C) (Oral)  Resp 18  Ht  (1.803 m)  Wt 193 lb (87.544 kg)  BMI 26.93 kg/m2  SpO2 100%  LMP 03/29/2015 Physical Exam  Constitutional: She is oriented to person, place, and time. She appears well-developed and well-nourished.  Non-toxic appearance. She does not appear ill. No distress.  HENT:  Head: Normocephalic and atraumatic.  Right Ear: External ear normal.  Left Ear: External ear normal.  Nose: Nose normal. No mucosal edema or rhinorrhea.  Mouth/Throat: Oropharynx is clear and moist and mucous membranes are normal. No dental abscesses or uvula swelling.  Eyes: Conjunctivae and EOM are normal. Pupils are equal, round, and reactive to light.  Neck: Normal range of motion and full passive range of motion without pain. Neck supple.  Cardiovascular: Normal rate, regular rhythm and normal heart sounds.  Exam reveals no gallop and no friction rub.   No murmur heard. Pulmonary/Chest: Effort normal and breath sounds normal. No respiratory distress. She has no wheezes. She has no rhonchi. She has no rales. She exhibits  no tenderness and no crepitus.  Abdominal: Soft. Normal appearance and bowel sounds are normal. She exhibits no distension. There is no tenderness. There is no rebound and no guarding.  Musculoskeletal: Normal range of motion. She exhibits no edema or tenderness.  Moves all extremities well.  Patient has a subcutaneous nodule deep in her subcutaneous tissue in her right posterior sacral area that is consistent with a possible local reaction to the Phenergan. At that time it is very firm to touch it does not feel like abscess. The skin over lying the area is normal.  Neurological: She is alert and oriented to person, place, and time. She has normal strength. No cranial nerve deficit.  Skin: Skin is warm, dry and intact. No rash noted. No erythema. No pallor.  Psychiatric: She has a normal mood and affect. Her speech is  normal and behavior is normal. Her mood appears not anxious.  Nursing note and vitals reviewed.   ED Course  Procedures (including critical care time)  I discussed with the patient what things to look for as far as the nodule in the skin in her right buttock area. We did an x-ray to see where her constipation is. When I review her x-ray she has lots is stool in the right colon. She will be advised on MiraLAX treatment.   Labs Review Labs Reviewed - No data to display  Imaging Review Dg Abd 1 View  04/20/2015  CLINICAL DATA:  Initial evaluation for constipation. EXAM: ABDOMEN - 1 VIEW COMPARISON:  Prior study from 03/23/2015. FINDINGS: Visualized bowel gas pattern within normal limits without evidence for obstruction. No abnormal bowel wall thickening. No free air on this limited supine view. Large amount of retained stool within the colon, suggesting constipation. No soft tissue mass or abnormal calcification. Visualized osseous structures demonstrate no acute abnormality. IMPRESSION: Large amount of retained stool within the visualized colon, suggestive of constipation.  Electronically Signed   By: Rise MuBenjamin  McClintock M.D.   On: 04/20/2015 04:56   I have personally reviewed and evaluated these images and lab results as part of my medical decision-making.   EKG Interpretation None      MDM   Final diagnoses:  Constipation, unspecified constipation type  Injection site reaction, initial encounter    OTC miralax  Plan discharge  Devoria AlbeIva Lyrique Hakim, MD, Concha PyoFACEP     Abayomi Pattison, MD 04/20/15 [phone]

## 2015-04-20 NOTE — Discharge Instructions (Signed)
Get miralax and put one dose or 17 g in 8 ounces of water,  take 1 dose every 30 minutes for 2-3 hours or until you  get good results and then once or twice daily to prevent constipation.  Recheck the area where you had the injection if it gets bigger, more painful, the skin gets red or hot to touch or you get a fever.    Constipation, Adult Constipation is when a person:  Poops (has a bowel movement) less than 3 times a week.  Has a hard time pooping.  Has poop that is dry, hard, or bigger than normal. HOME CARE   Eat foods with a lot of fiber in them. This includes fruits, vegetables, beans, and whole grains such as brown rice.  Avoid fatty foods and foods with a lot of sugar. This includes french fries, hamburgers, cookies, candy, and soda.  If you are not getting enough fiber from food, take products with added fiber in them (supplements).  Drink enough fluid to keep your pee (urine) clear or pale yellow.  Exercise on a regular basis, or as told by your doctor.  Go to the restroom when you feel like you need to poop. Do not hold it.  Only take medicine as told by your doctor. Do not take medicines that help you poop (laxatives) without talking to your doctor first. GET HELP RIGHT AWAY IF:   You have bright red blood in your poop (stool).  Your constipation lasts more than 4 days or gets worse.  You have belly (abdominal) or butt (rectal) pain.  You have thin poop (as thin as a pencil).  You lose weight, and it cannot be explained. MAKE SURE YOU:   Understand these instructions.  Will watch your condition.  Will get help right away if you are not doing well or get worse.   This information is not intended to replace advice given to you by your health care provider. Make sure you discuss any questions you have with your health care provider.   Document Released: 06/26/2007 Document Revised: 01/28/2014 Document Reviewed: 10/19/2012 Elsevier Interactive Patient  Education [employer]2016 [employer] Inc.

## 2016-06-22 IMAGING — DX DG ABDOMEN ACUTE W/ 1V CHEST
4 series · 4 of 4 positions shown · non-contrast
Comparison: Chest radiograph December 13, 2013

CLINICAL DATA: Weakness beginning at 6266 hours, nausea, vomiting
and diarrhea. History gastric sleeve, Grave's disease.

EXAM:
DG ABDOMEN ACUTE W/ 1V CHEST

[chest pa]
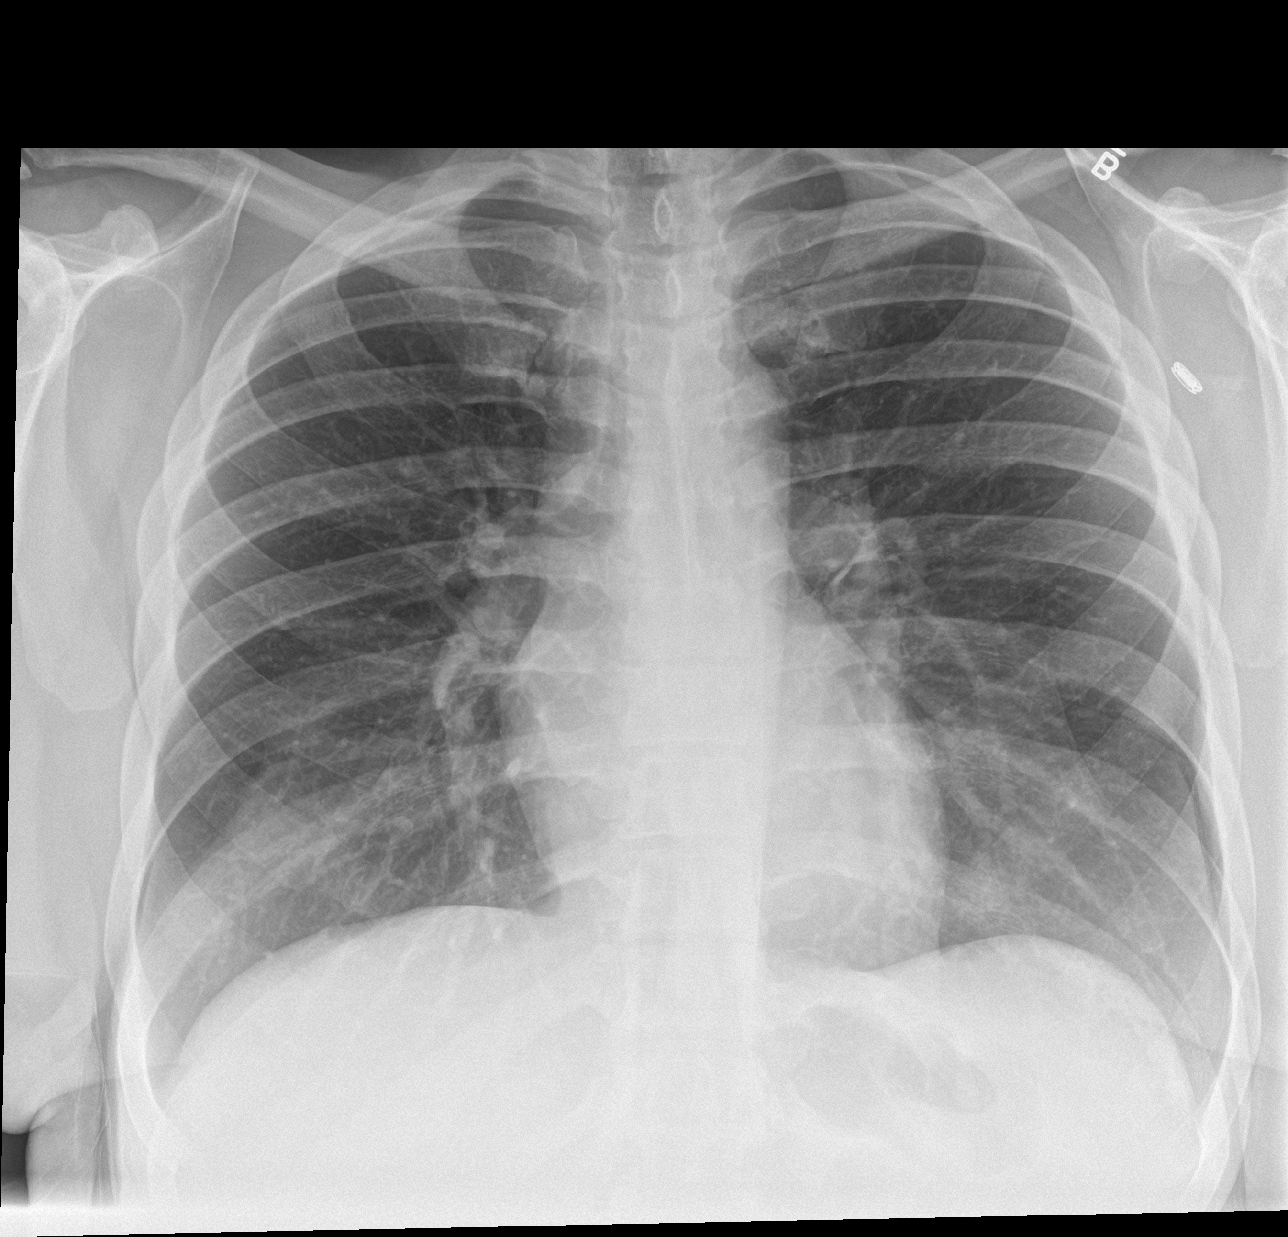

[abdomen erect]
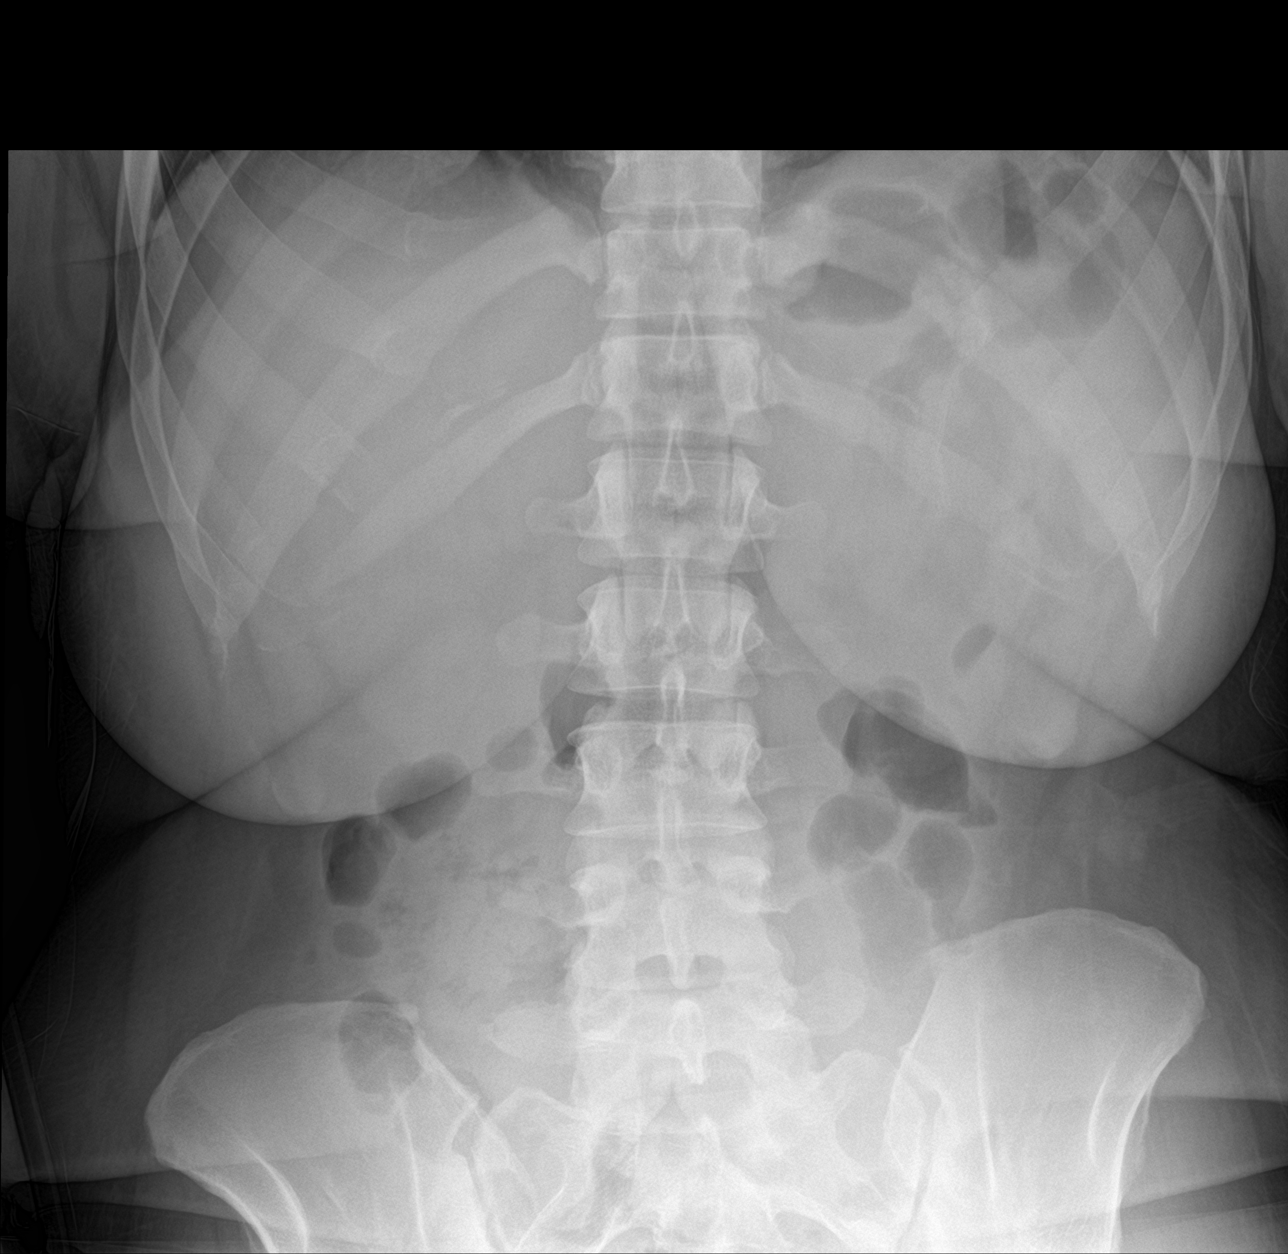

[abdomen supine (1 of 2)]
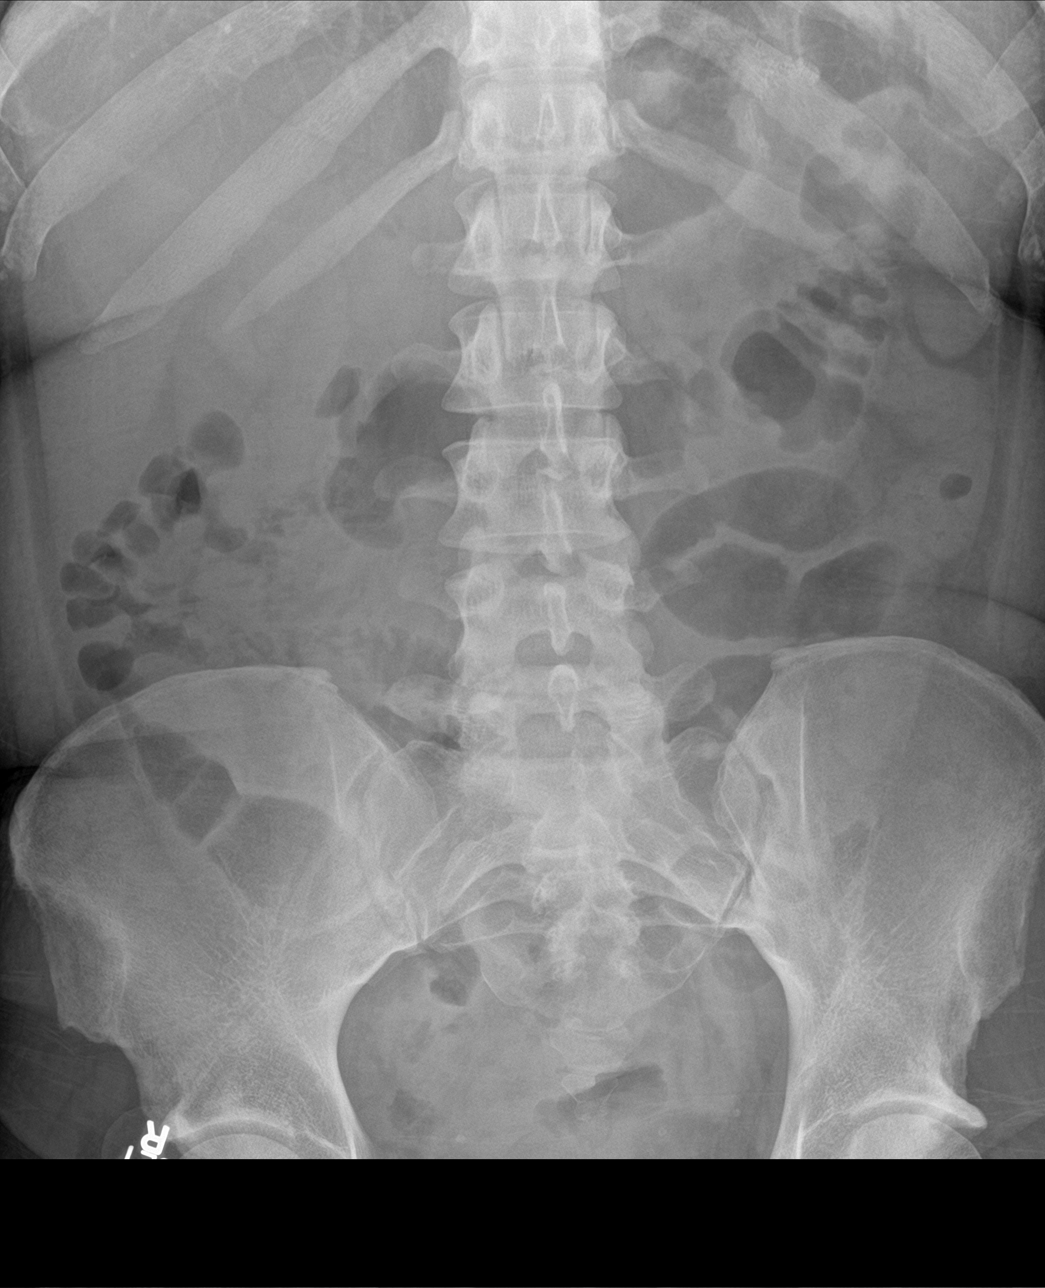

[abdomen supine (2 of 2)]
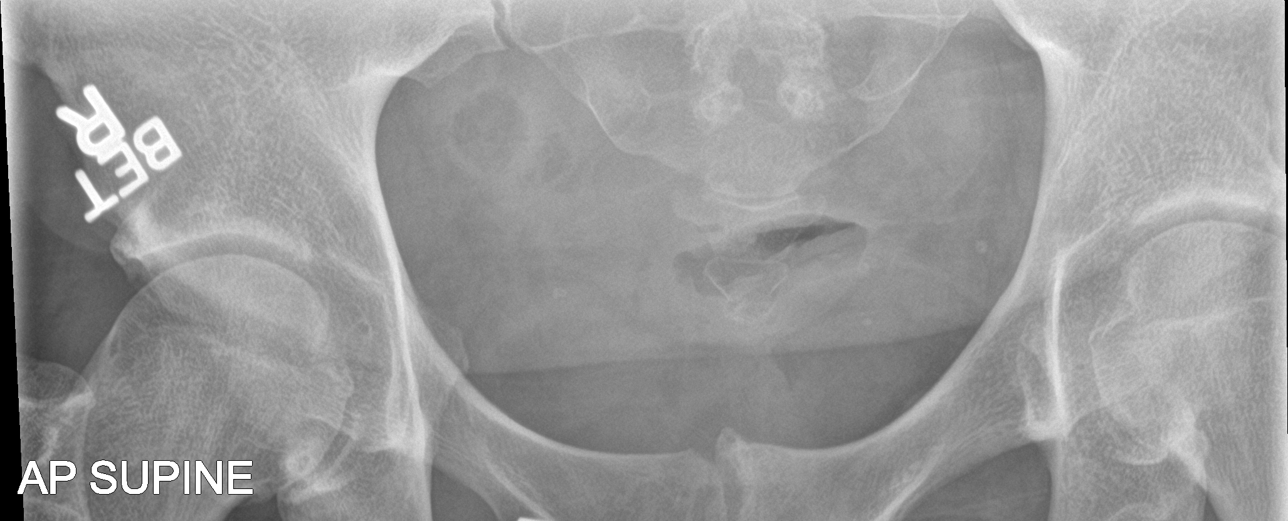

[4 of 4 positions shown; findings below may reference images not displayed]

FINDINGS: Cardiomediastinal silhouette is normal. Lungs are clear, no pleural
effusions. No pneumothorax. Soft tissue planes and included osseous
structures are unremarkable.

Bowel gas pattern is nondilated and nonobstructive. Surgical suture
material LEFT upper quadrant consistent with history of gastric
bypass. No intra-abdominal mass effect, pathologic calcifications or
free air. Phleboliths in the pelvis. Soft tissue planes and included
osseous structures are non-suspicious.
IMPRESSION: Normal chest.

Normal bowel gas pattern.

## 2023-12-15 LAB — AMB RESULTS CONSOLE CBG: Glucose: 82
